# Patient Record
Sex: Female | Born: 1975 | Race: Black or African American | Hispanic: No | Marital: Single | Smoking: Never smoker
Health system: Southern US, Community
[De-identification: ages and names within clinical notes are randomized; demographics above are authoritative.]

## PROBLEM LIST (undated history)

## (undated) DIAGNOSIS — K219 Gastro-esophageal reflux disease without esophagitis: Secondary | ICD-10-CM

## (undated) DIAGNOSIS — F419 Anxiety disorder, unspecified: Secondary | ICD-10-CM

## (undated) DIAGNOSIS — I10 Essential (primary) hypertension: Secondary | ICD-10-CM

## (undated) HISTORY — PX: CHOLECYSTECTOMY: SHX55

---

## 1998-12-18 ENCOUNTER — Emergency Department (HOSPITAL_COMMUNITY): Admission: EM | Admit: 1998-12-18 | Discharge: 1998-12-18 | Payer: Self-pay | Admitting: Emergency Medicine

## 2000-09-13 ENCOUNTER — Inpatient Hospital Stay (HOSPITAL_COMMUNITY): Admission: AD | Admit: 2000-09-13 | Discharge: 2000-09-13 | Payer: Self-pay | Admitting: *Deleted

## 2000-09-13 ENCOUNTER — Inpatient Hospital Stay (HOSPITAL_COMMUNITY): Admission: AD | Admit: 2000-09-13 | Discharge: 2000-09-15 | Payer: Self-pay | Admitting: Obstetrics & Gynecology

## 2000-10-19 ENCOUNTER — Inpatient Hospital Stay (HOSPITAL_COMMUNITY): Admission: AD | Admit: 2000-10-19 | Discharge: 2000-10-19 | Payer: Self-pay | Admitting: Obstetrics and Gynecology

## 2000-10-19 ENCOUNTER — Encounter: Payer: Self-pay | Admitting: Obstetrics and Gynecology

## 2000-11-12 ENCOUNTER — Encounter (HOSPITAL_BASED_OUTPATIENT_CLINIC_OR_DEPARTMENT_OTHER): Payer: Self-pay | Admitting: General Surgery

## 2000-11-13 ENCOUNTER — Emergency Department (HOSPITAL_COMMUNITY): Admission: EM | Admit: 2000-11-13 | Discharge: 2000-11-13 | Payer: Self-pay | Admitting: Emergency Medicine

## 2000-11-13 ENCOUNTER — Encounter: Payer: Self-pay | Admitting: Emergency Medicine

## 2000-11-14 ENCOUNTER — Ambulatory Visit (HOSPITAL_COMMUNITY): Admission: RE | Admit: 2000-11-14 | Discharge: 2000-11-16 | Payer: Self-pay | Admitting: General Surgery

## 2000-11-14 ENCOUNTER — Encounter (INDEPENDENT_AMBULATORY_CARE_PROVIDER_SITE_OTHER): Payer: Self-pay | Admitting: Specialist

## 2000-11-14 ENCOUNTER — Encounter (HOSPITAL_BASED_OUTPATIENT_CLINIC_OR_DEPARTMENT_OTHER): Payer: Self-pay | Admitting: General Surgery

## 2000-11-15 ENCOUNTER — Encounter (HOSPITAL_BASED_OUTPATIENT_CLINIC_OR_DEPARTMENT_OTHER): Payer: Self-pay | Admitting: General Surgery

## 2000-11-20 ENCOUNTER — Encounter: Payer: Self-pay | Admitting: Emergency Medicine

## 2000-11-20 ENCOUNTER — Inpatient Hospital Stay (HOSPITAL_COMMUNITY): Admission: EM | Admit: 2000-11-20 | Discharge: 2000-11-23 | Payer: Self-pay | Admitting: Emergency Medicine

## 2000-11-22 ENCOUNTER — Encounter: Payer: Self-pay | Admitting: Gastroenterology

## 2000-11-23 ENCOUNTER — Encounter (HOSPITAL_BASED_OUTPATIENT_CLINIC_OR_DEPARTMENT_OTHER): Payer: Self-pay | Admitting: General Surgery

## 2000-11-24 ENCOUNTER — Encounter: Payer: Self-pay | Admitting: Gastroenterology

## 2000-11-24 ENCOUNTER — Emergency Department (HOSPITAL_COMMUNITY): Admission: EM | Admit: 2000-11-24 | Discharge: 2000-11-24 | Payer: Self-pay | Admitting: Emergency Medicine

## 2000-12-24 ENCOUNTER — Encounter (HOSPITAL_BASED_OUTPATIENT_CLINIC_OR_DEPARTMENT_OTHER): Payer: Self-pay | Admitting: General Surgery

## 2000-12-24 ENCOUNTER — Emergency Department (HOSPITAL_COMMUNITY): Admission: EM | Admit: 2000-12-24 | Discharge: 2000-12-24 | Payer: Self-pay | Admitting: Emergency Medicine

## 2000-12-27 ENCOUNTER — Inpatient Hospital Stay (HOSPITAL_COMMUNITY): Admission: RE | Admit: 2000-12-27 | Discharge: 2000-12-28 | Payer: Self-pay | Admitting: Gastroenterology

## 2000-12-27 ENCOUNTER — Encounter: Payer: Self-pay | Admitting: Gastroenterology

## 2001-02-04 ENCOUNTER — Emergency Department (HOSPITAL_COMMUNITY): Admission: EM | Admit: 2001-02-04 | Discharge: 2001-02-04 | Payer: Self-pay

## 2001-05-14 ENCOUNTER — Encounter: Payer: Self-pay | Admitting: Emergency Medicine

## 2001-05-14 ENCOUNTER — Emergency Department (HOSPITAL_COMMUNITY): Admission: EM | Admit: 2001-05-14 | Discharge: 2001-05-14 | Payer: Self-pay | Admitting: Emergency Medicine

## 2003-05-12 ENCOUNTER — Encounter: Payer: Self-pay | Admitting: Emergency Medicine

## 2003-05-13 ENCOUNTER — Observation Stay (HOSPITAL_COMMUNITY): Admission: AD | Admit: 2003-05-13 | Discharge: 2003-05-13 | Payer: Self-pay | Admitting: Neurosurgery

## 2003-05-27 ENCOUNTER — Encounter: Admission: RE | Admit: 2003-05-27 | Discharge: 2003-05-27 | Payer: Self-pay | Admitting: Neurosurgery

## 2004-03-25 ENCOUNTER — Emergency Department (HOSPITAL_COMMUNITY): Admission: EM | Admit: 2004-03-25 | Discharge: 2004-03-25 | Payer: Self-pay | Admitting: Emergency Medicine

## 2004-11-02 ENCOUNTER — Emergency Department (HOSPITAL_COMMUNITY): Admission: EM | Admit: 2004-11-02 | Discharge: 2004-11-02 | Payer: Self-pay | Admitting: Emergency Medicine

## 2008-10-04 ENCOUNTER — Emergency Department (HOSPITAL_COMMUNITY): Admission: EM | Admit: 2008-10-04 | Discharge: 2008-10-04 | Payer: Self-pay | Admitting: Emergency Medicine

## 2008-10-25 ENCOUNTER — Emergency Department (HOSPITAL_COMMUNITY): Admission: EM | Admit: 2008-10-25 | Discharge: 2008-10-25 | Payer: Self-pay | Admitting: Family Medicine

## 2008-11-06 ENCOUNTER — Emergency Department (HOSPITAL_COMMUNITY): Admission: EM | Admit: 2008-11-06 | Discharge: 2008-11-06 | Payer: Self-pay | Admitting: Family Medicine

## 2008-12-23 ENCOUNTER — Ambulatory Visit (HOSPITAL_COMMUNITY): Admission: RE | Admit: 2008-12-23 | Discharge: 2008-12-23 | Payer: Self-pay | Admitting: Cardiology

## 2009-06-04 ENCOUNTER — Emergency Department (HOSPITAL_COMMUNITY): Admission: EM | Admit: 2009-06-04 | Discharge: 2009-06-04 | Payer: Self-pay | Admitting: Emergency Medicine

## 2009-10-17 ENCOUNTER — Emergency Department (HOSPITAL_COMMUNITY): Admission: EM | Admit: 2009-10-17 | Discharge: 2009-10-17 | Payer: Self-pay | Admitting: Emergency Medicine

## 2009-10-23 ENCOUNTER — Emergency Department (HOSPITAL_COMMUNITY): Admission: EM | Admit: 2009-10-23 | Discharge: 2009-10-23 | Payer: Self-pay | Admitting: Emergency Medicine

## 2010-08-07 ENCOUNTER — Encounter: Payer: Self-pay | Admitting: Gastroenterology

## 2010-10-25 LAB — POCT CARDIAC MARKERS
CKMB, poc: <1 ng/mL — ABNORMAL LOW (ref 1.0–8.0)
Troponin i, poc: <0.05 ng/mL (ref 0.00–0.09)

## 2010-10-25 LAB — CBC
HCT: 38.5 % (ref 36.0–46.0)
Hemoglobin: 13.4 g/dL (ref 12.0–15.0)
RDW: 12.5 % (ref 11.5–15.5)
WBC: 6 10*3/uL (ref 4.0–10.5)

## 2010-10-25 LAB — DIFFERENTIAL
Eosinophils Relative: 8 % — ABNORMAL HIGH (ref 0–5)
Lymphocytes Relative: 25 % (ref 12–46)
Lymphs Abs: 1.5 10*3/uL (ref 0.7–4.0)
Monocytes Absolute: 0.4 10*3/uL (ref 0.1–1.0)

## 2010-10-25 LAB — BASIC METABOLIC PANEL
GFR calc non Af Amer: >60 mL/min (ref >60)
Glucose, Bld: 94 mg/dL (ref 70–99)
Potassium: 2.9 mEq/L — ABNORMAL LOW (ref 3.5–5.1)
Sodium: 138 mEq/L (ref 135–145)

## 2010-12-01 NOTE — Consult Note (Signed)
[facility]  Patient:    Christie Buckley, Christie Buckley                  MRN: [id number] Proc. Date: 11/24/00 Adm. Date:  [id number] Disc. Date: [id number] Attending:  Sandi Raveling CC:         Verlin Grills, M.D.  [name] [name], M.D.   Consultation Report  REASON FOR CONSULTATION:  Abdominal pain after ERCP.  HISTORY OF ILLNESS:  Patient is a 35 year old white female who underwent laparoscopic cholecystectomy on Nov 13, 2000, with postoperative ERCP which was apparently unrevealing on May 2nd.  On May 8th, she developed increased abdominal pain and elevated liver function tests and on the 10th, underwent ERCP to rule out retained common bile duct stones.  This revealed no stones but revealed an apparent common hepatic duct stricture possibly related to clips from the previous surgery.  She underwent repeat ERCP on May 11th with placement of a stent.  Approximately 4 oclock, approximately one hour after leaving the hospital, she began developing abdominal pain which persisted through the night, mainly on the right upper quadrant and the right flank, worsened by meals, and this morning, she vomited after taking Vicodin.  She was also discharged on an antibiotic which I believe was Cipro.  She called me and I had her come to the emergency room.  She has had a bowel movement since the ERCP yesterday and has not had any abdominal distention.  She had stable vital signs in the emergency room and her pain spontaneously decreased, although she initially rated it as a 10 on a scale of 1 to 10.  PAST MEDICAL HISTORY:  Essentially unremarkable except for the above.  The patient delivered a child three months ago.  PHYSICAL EXAMINATION:  GENERAL:  Well-developed, well-nourished black female currently in no acute distress.  VITAL SIGNS:  Blood pressure 157/94, pulse 85, respirations 24, temperature 98.6.  HEENT:  Unremarkable.  No scleral  icterus.  LUNGS:  Clear.  HEART:  Regular rate and rhythm without murmur.  ABDOMEN:  Soft, nondistended, with normoactive bowel sounds.  No hepatosplenomegaly or masses.  Some tenderness in the right upper quadrant and epigastric area with no rebound.  LABORATORY DATA:  Amylase 95, lipase 61, SGOT 33, SGPT 271, alkaline phosphatase 257, total bilirubin 0.8.  On Nov 21, 2000, bilirubin was 2.9, alkaline phosphatase 351, SGOT 565, SGPT 775, amylase 85, lipase 52.  Flat and upright abdominal films show no free air, obstruction or ileus and the biliary stent appears to be in good position.  IMPRESSION:  Abdominal pain after endoscopic retrograde cholangiopancreatogram and laparoscopic cholecystectomy, with minimally elevated lipase but normal amylase and improving liver function tests after stent placement.  I do not see any evidence of overt pancreatitis and no signs of clinical instability by labs, vital signs and abdominal exam, no evidence of perforation or stent malfunction at this time.  PLAN:  I am comfortable managing as an outpatient with clear liquid diet today, continuation of p.r.n. Vicodin and her antibiotics as prescribed.  She will make an appointments with Dr. Luisa Hart L. Ballen and Dr. Charolett Bumpers III and will call at any point if her pain worsens or if she has recurrent vomiting, fever or jaundice. DD:  11/24/00 TD:  11/25/00 Job: [id number]

## 2010-12-01 NOTE — Procedures (Signed)
[place]  Patient:    Christie Buckley, Christie Buckley                    MRN: [id number] Proc. Date: 11/23/00 Attending:  Verlin Grills, M.D. CC:         [name]. [name], M.D.   Procedure Report  PROCEDURE:  Endoscopic retrograde cholangiography with a 7 French, 12 cm length biliary stent placement.  PROCEDURE INDICATION:  Christie Buckley (date of birth 1976/01/17) is a 35 year old female with a common hepatic duct stricture.  She is seen today to place a biliary stent.  Approximately one week ago, I performed an endoscopic retrograde cholangiogram with endoscopic sphincterotomy to remove distal common bile duct stones.  She tolerated the procedure last week well.  Christie Buckley was readmitted to the hospital Nov 20, 2000, with recurrent abdominal pain and intrahepatic biliary ductal dilation by CT scan of the abdomen.  Her bilirubin and liver enzymes were elevated.  Last night I performed an endoscopic retrograde cholangiogram, which revealed a common hepatic duct stricture.  I was unable to place the biliary stent last night, and I am seeing her this morning to place the biliary stent.  ENDOSCOPIST:  Verlin Grills, M.D.  PREMEDICATION:  Fentanyl 250 mcg, Versed 22.5 mg.  ENDOSCOPE:  Olympus therapeutic duodenoscope.  A 7 French, 12 cm length Cotton plastic biliary stent.  DESCRIPTION OF PROCEDURE:  After obtaining informed consent, Ms. Carrier was placed in the prone position on the fluoroscopy table.  I administered intravenous fentanyl and intravenous Versed to achieve conscious sedation for the procedure.  The patients blood pressure, oxygen saturation, and cardiac rhythm were monitored throughout the procedure and documented in the medical record.  The Olympus therapeutic duodenoscope was passed through the posterior hypopharynx and down the esophagus without difficulty.  I did not examine the esophagus with  the side-viewing duodenoscope.  The stomach was entered.  A normal-appearing pylorus was intubated and the endoscope advanced to the second portion of the duodenum.  The sphincterotomy of the major papilla was widely patent.  The common bile duct was selectively cannulated with the sphincterotome.  A cholangiogram was performed, revealing the common hepatic duct stricture.  A guidewire was placed into the intrahepatic ductal system.  The 11-19-08 [place] biliary dilator was passed.  I attempted to place a 10 [place] biliary stent but was unable to push the stent across the common hepatic duct stricture.  The 10 [place] biliary stent was removed.  Over the guidewire, the 7 [place], 10 cm Cotton plastic biliary stent was easily placed.  The patient tolerated the procedure well.  ASSESSMENT:  Common hepatic duct stricture, stented with the 7 French, 12 cm length biliary stent.  RECOMMENDATIONS:  I will discuss future management of this patient with Drs. Leonie Man, Dr. Caralyn Guile at [facility], and possibly seek advice from the [facility] in [place].  Options include surgery (hepatojejunostomy) versus endoscopic stenting over a nine to 12 month period of time. DD:  11/23/00 TD:  11/25/00 Job: [id number]

## 2010-12-01 NOTE — Discharge Summary (Signed)
[place]  Patient:    Christie Buckley, Christie Buckley                  MRN: [id number] Adm. Date:  [id number] Disc. Date: [id number] Attending:  Sandi Raveling CC:         [name]. [name], M.D. x 2  Charolett Bumpers III, M.D.   Discharge Summary  ADMITTING DIAGNOSIS:  Biliary obstruction.  DISCHARGE DIAGNOSIS:  Hepatic duct stricture causing biliary obstruction.  PROCEDURES:  Endoscopic retrograde cholangiopancreatography with stent placement.  COMPLICATIONS:  None.  CONDITION ON DISCHARGE:  Improved.  HISTORY OF PRESENT ILLNESS:  Christie Buckley is a 35 year old woman who was recently postpartum some time ago with severe biliary symptoms.  She was admitted to the hospital and underwent laparoscopic cholecystectomy with intraoperative cholangiogram on Nov 13, 2000.  During her cholangiogram, it showed indications of obstruction and she was then treated in consultation by Dr. Charolett Bumpers III with an ERCP and an endoscopic retrograde sphincterotomy with clearing of her common bile duct. She was discharged and was doing well at home but returned on Nov 20, 2000 to the emergency room complaining of severe abdominal pain.  On evaluation, it was noted that she had elevated bilirubin of 2.8, SGOT of 496, SGPT of 8.9, and alkaline phosphatase is elevated to 367.  She was admitted to the hospital.  She was afebrile.  She was seen again in consultation by Dr. Charolett Bumpers III and repeat ERCP done on Nov 22, 2000 showed evidence of a common hepatic duct stricture.  Close to this area, a stricture is a cystic duct clip.  The stricture starts somewhat below the level of the clip in the proximal common bile duct and extends through the region of the common hepatic duct proximally.  She was then returned to the endoscopy suite on Nov 23, 2000 where she had a size 7 [place] stent placed across the stricture.  She is being discharged to follow up  in the office in approximately 10 days.  DISCHARGE MEDICATIONS: 1. Bactrim DS 1 p.o. b.i.d. 2. Vicodin 1 q.4h. p.r.n. pain.  ACTIVITY:  Unrestricted.  DIET:  Unrestricted.  DISCHARGE INSTRUCTIONS:  The patient is to call for any recurrent pain, fever, nausea, or vomiting. DD:  11/23/00 TD:  11/26/00 Job: [id number]

## 2010-12-01 NOTE — Consult Note (Signed)
NAME:  Christie Buckley, Christie Buckley NO.:  [id number]   MEDICAL RECORD NO.:  [id number]                   PATIENT TYPE:  EMS   LOCATION:  ED                                   FACILITY:  [facility]   PHYSICIAN:  Carren Rang, M.D.                 DATE OF BIRTH:  1976-01-02   DATE OF CONSULTATION:  05/06/2003  DATE OF DISCHARGE:                                   CONSULTATION   ADMITTING DIAGNOSIS:  C1-2 early subluxation.   HISTORY OF PRESENT ILLNESS:  The patient is a very pleasant 35 year old  female who was at work earlier today and reached up and was stretching her  neck and her shoulders, and felt a pop in her neck, and experienced  immediate pain with left-ward head deviation and severe muscle spasm on the  side of her neck.  The patient also experienced at that time some transient  numbness on the right side of her face, right arm, and both legs.  The  patient subsequently was taken to the emergency room was evaluated, and neck  CT revealed possible early subluxation of C1-2, and we are consulted.  Currently, the patient said her pain is significantly improved.  She was  given 5 mg of p.o. Valium in the ER.  She had complete resolution of the  sensation of her face, arms, and legs.  She has no numbness or tingling in  her face, her arms, her legs.  No weakness that she has noted.  No blurred  vision.  No other bulbar signs.   PAST HISTORY:  Negative.   SURGICAL HISTORY:  Negative.   MEDICATION ALLERGIES:  None.   MEDICATIONS:  She is on no medications currently.   PHYSICAL EXAMINATION:  GENERAL:  She is an awake and alert 34 year old  female in no apparent distress.  HEENT:  Within normal limits.  Extraocular movements are intact.  NECK:  Appears to be in mid-position.  NEUROLOGIC:  Cranial nerves are intact.  Pupils are equal, round and  reactive to light.  Her strength is 5/5 in her sternocleidomastoid strap  muscles, deltoid, biceps, triceps, wrist  flexion and extension, hand  intrinsics, as well as psoas.  She has reflexes that are 3+, but appear to  be nonpathologic.  She has 1-2 beats of ankle clonus, but she has no  positive Babinski.  Her toes are downgoing bilaterally.  She has negative  Hoffman's sign.  She is in a cervical collar.  On palpation, she does have  some midline tenderness around the C1-2 joint, but her head is in good  position.  She seems to be moving it without difficulty, and she is  describing her pain as now a 4/10.   Her MRI scan subsequent to the CT scan did not show any evidence of a rotary  subluxation.  It did not show, at least preliminarily, any evidence of  transverse disruption or  ligamentous injury; however, this is not officially  read out yet.    PLAN:  My plan is to transfer her from [facility] to [facility], admit for  23 hour observation, leave her in a cervical collar, repeat a CT scan to  confirm that her alignment is normal.  Will await the final read of her MRI  scan to rule out any laxity of the transverse ligament or subsequent  _________ ligament, and pending the results of that make a disposition  regarding any intervention.  Will place her on Valium, as well as Lortab,  and activity ad lib.     Donalee Citrin, M.D.                           Carren Rang, M.D.    GC/MEDQ  D:  05/12/2003  T:  05/13/2003  Job:  [id number]

## 2010-12-01 NOTE — Op Note (Signed)
. [facility]  Patient:    Christie Buckley, Christie Buckley                  MRN: [id number] Proc. Date: 11/14/00 Adm. Date:  [id number] Disc. Date: [id number] Attending:  Devoria Albe CC:         Miguel Aschoff, M.D.   Operative Report  PREOPERATIVE DIAGNOSIS:  Chronic calculous cholecystitis.  POSTOPERATIVE DIAGNOSES:  Chronic calculous cholecystitis and choledocholithiasis.  PROCEDURE:  Laparoscopic cholecystectomy, intraoperative cholangiogram.  SURGEON:  Luisa Hart L. [name], M.D.  ASSISTANT:  Marnee Spring. Wiliam Ke, M.D.  ANESTHESIA:  General.  CLINICAL NOTE:  Christie Buckley is a 35 year old recently postpartum female who presents with right upper quadrant abdominal pain, which on evaluation shows that she has cholelithiasis.  Liver function studies have been within normal limits, no evidence of hyperamylasemia or elevations of her lipase. She comes to the operating room now for laparoscopic cholecystectomy.  DESCRIPTION OF PROCEDURE:  Following the induction of satisfactory anesthesia, with the patient positioned supinely, the abdomen was prepped and draped routinely.  Open laparoscopy is created at her umbilicus with insufflation of the peritoneal cavity to 14 mmHg pressure following the insertion of a Hasson cannula.  The camera is inserted, the abdomen explored.  The liver edges are sharp, liver surfaces smooth.  The gallbladder had some mild amount of scarring but was otherwise filled with multiple stones.  Duodenal sweep appeared to be normal.  Anterior gastric wall, small and large intestines were viewed, and they appeared normal.  The adnexal structures and pelvic organs were not visualized.  Under direct vision, epigastric and lateral ports were placed and the gallbladder was grasped and retracted cephalad.  Dissection carried down in the region of the ampulla with isolation of the cystic artery and cystic duct.  Cystic artery was doubly clipped and  transected.  Cystic duct is clipped proximally and opened.  Upon opening the cystic duct, multiple stones could be milked from the cystic duct and from the common duct.  I inserted a Reddick catheter into the cystic duct and injected one-half strength Hypaque dye into the biliary system.  It showed contrast entering up to the upper radicles and then some delayed filling of the common bile duct, showing multiple filling defects within the common bile duct, and there was no visualization of the duodenum.  The cholangiocatheter was then removed, and the cystic duct was doubly clipped and transected.  The gallbladder was dissected free from the liver bed and removed and forwarded for pathologic evaluation.  The abdomen was thoroughly irrigated with normal saline and sucked dry.  All areas of dissection were checked for hemostasis and noticed to be dry.  The pneumoperitoneum was allowed to deflate and the trocars removed under direct vision.  Wounds closed in layers as follows:  Umbilical wound in two layers with 0 Dexon and 4-0 Dexon, epigastric and lateral flank wounds with 4-0 Dexon sutures.  All wounds reinforced with Steri-Strips and sterile dressings applied.  The patient then removed from the operating room to the recovery room in stable condition.  She tolerated the procedure well. DD:  11/14/00 TD:  11/15/00 Job: [id number]

## 2010-12-01 NOTE — H&P (Signed)
[facility]  Patient:    Christie Buckley, Christie Buckley                    MRN: [id number] Adm. Date:  12/27/00 Attending:  Verlin Grills, M.D. CC:         [name]. [name], M.D.  [name], M.D., Division of Digestive Diseases,  Dept. of Internal Medicine, [facility]   History and Physical  PROBLEMS: 1. Common hepatic duct stricture. 2. Nov 13, 2000, laparoscopic cholecystectomy. 3. Nov 15, 2000, endoscopic retrograde cholangiography with endoscopic    sphincterotomy and common bile duct stone fragment removal. 4. Nov 23, 2000, placement of a 7-French 12-cm length biliary stent.  HISTORY:  Christie Buckley is a 35 year old female.  On Nov 13, 2000, she had a laparoscopic cholecystectomy performed by Dr. Luisa Hart L. Ballen.  Her operative cholangiogram showed possible common bile duct obstruction.  On Nov 15, 2000, she underwent an endoscopic retrograde cholangiogram with endoscopic sphincterotomy to remove small common bile duct stone fragments performed by me.  On Nov 23, 2000, Christie Buckley was rehospitalized with abdominal pain, elevated liver enzymes and a cholangitis picture.  Repeat endoscopic retrograde cholangiogram revealed a proximal common hepatic duct stricture; a 7-French 12-cm length plastic biliary stent was placed.  On December 24, 2000, her hepatic enzymes were completely normal.  Christie Buckley is seen today to remove the 7-French 12-cm length plastic biliary stent and reexamine her common bile duct, common hepatic duct and intrahepatic ducts.  MEDICATION ALLERGIES:  None.  CURRENT MEDICATIONS: 1. Cipro 500 mg b.i.d. 2. Oxycodone/acetaminophen.  PAST MEDICAL HISTORY:  Nov 13, 2000, laparoscopic cholecystectomy; Nov 15, 2000, endoscopic retrograde cholangiogram with endoscopic sphincterotomy; Nov 23, 2000, 7-French 12-cm plastic biliary stent placed to stent a proximal common hepatic duct stricture.  PHYSICAL  EXAMINATION:  GENERAL APPEARANCE:  Christie Buckley appears quite healthy.  HEENT:  Sclerae nonicteric.  LUNGS:  Clear to auscultation.  CARDIAC:  Regular rhythm without murmurs.  ABDOMEN:  Soft, flat and nontender. DD:  12/27/00 TD:  12/27/00 Job: [id number]

## 2010-12-01 NOTE — Consult Note (Signed)
[facility]  Patient:    Christie Buckley, Christie Buckley                    MRN: [id number] Proc. Date: 12/24/00 Attending:  Verlin Grills, M.D. CC:         [name]. [name], M.D.   Consultation Report  PROBLEM:  Unexplained chest and abdominal pain.  HISTORY OF PRESENT ILLNESS:  [name] (date of birth:  09-10-1975) is a 35 year old female. Approximately four months ago, she had an uncomplicated childbirth.  Nov 13, 2000, she underwent a laparoscopic cholecystectomy with intraoperative cholangiogram revealing possible biliary obstruction. On Nov 15, 2000, she underwent an endoscopic retrograde cholangiogram with endoscopic sphincterotomy to remove distal common bile duct stones. On Nov 23, 2000, Christie Buckley was hospitalized with abdominal pain and elevated liver enzymes. Repeat endoscopic retrograde cholangiogram revealed a patent endoscopic sphincterotomy and probable ischemic proximal common hepatic duct stricture. A 7-French, 12 cm length plastic biliary stent was placed.  Christie Buckley presents to the [facility] Emergency Room with a two-week history of intermittent anterior and lower abdominal discomfort without vomiting or chills. She has been somewhat constipated. She reports a normal appetite. She denies breathing difficulty. She has been taking Darvocet for pain as an outpatient.  CURRENT MEDICATIONS:  Darvocet.  PAST MEDICAL HISTORY: 1. Childbirth four months ago. 2. Laparoscopic cholecystectomy, Nov 13, 2000. 3. Endoscopic retrograde cholangiogram with endoscopic sphincterotomy, Nov 15, 2000. 4. Endoscopic placement of a 7-French, 12 cm length plastic biliary stent, Nov 23, 2000, to manage a common hepatic duct stricture.  PHYSICAL EXAMINATION:  VITAL SIGNS:  Blood pressure 147/95, temperature 99.4 degrees.  GENERAL:  Christie Buckley is lying comfortably on the emergency room stretcher.  HEENT:  Sclerae  nonicteric.  CARDIAC:  Regular rhythm without murmurs.  ABDOMEN:  Soft and flat. No hepatosplenomegaly or palpable masses. The patient has a discomfort across her lower abdomen to deep palpation, but there are no signs of peritonitis. She reports no dysuria.  LUNGS:  Clear.  LABORATORY DATA:  White blood cell count 4900, hemoglobin 13.6 g. Complete metabolic profile was normal, including normal liver enzymes and bilirubin. Serum amylase and lipase normal. Urinalysis pending.  Acute abdominal x-ray series reveal several dilated small bowel loops without radiographic evidence of obstruction or free air.  PLAN:  I am starting Cipro 500 mg b.i.d. and scheduling Christie Buckley for an endoscopic retrograde cholangiogram to remove her 7-French, 12 cm biliary stent. DD:  12/24/00 TD:  12/24/00 Job: [id number]

## 2010-12-01 NOTE — Procedures (Signed)
[facility]  Patient:    Christie Buckley, Christie Buckley                    MRN: [id number] Proc. Date: 12/27/00 Adm. Date:  12/27/00 Attending:  Verlin Grills, M.D. CC:         [name]. [name], M.D.  [name], M.D., Division of Digestive Diseases,  Dept. of Internal Medicine, [facility]   Procedure Report  PROCEDURE:  Endoscopic retrograde cholangiography with biliary stent exchange.  ENDOSCOPIST:  Verlin Grills, M.D.  DESCRIPTION OF PROCEDURE:  After obtaining informed consent, [name] was placed in the prone position on the fluoroscopy table.  I administered intravenous Versed (15 mg) and intravenous Demerol (140 mg) to achieve conscious sedation throughout the procedure; she also received glucagon to control duodenal peristalsis.  The Olympus therapeutic duodenoscope was passed through the posterior hypopharynx, down the esophagus and into the proximal stomach without difficulty.  The esophagus was not examined.  The distal gastric antrum and pylorus appeared normal.  The pylorus was easily intubated and the endoscope advanced to the second portion of the duodenum without examining the duodenal bulb.  Major papilla:  The major papilla was easily identified with the 7-French 12-cm biliary stent extruding through the patent endoscopic sphincterotomy.  The biliary stent was easily removed.  The 15-mm balloon occlusion catheter was placed into the proximal common hepatic duct.  The entire biliary tree was x-rayed using contrast through the occlusion balloon catheter revealing a slightly narrowed proximal common hepatic duct.  There were no tight strictures or filling defects noted.  The 11-19-08 graduated dilating catheter was easily placed across the common hepatic duct stricture with a guidewire in place.  The 10-French 12-cm length plastic biliary stent was easily placed to stent the common hepatic duct stricture.  The  pancreatic duct was never cannulated and contrast was never injected into the pancreatic duct.  ASSESSMENT:  Mild common hepatic duct stricture, post laparoscopic cholecystectomy, stented with the 10-French 12-cm plastic biliary stent. DD:  12/27/00 TD:  12/27/00 Job: [id number]

## 2010-12-01 NOTE — Procedures (Signed)
[place]. [facility]  Patient:    Christie Buckley, Christie Buckley                  MRN: [id number] Proc. Date: 11/15/00 Adm. Date:  [id number] Disc. Date: [id number] Attending:  Sonda Primes CC:         [name]. [name], M.D.   Procedure Report  PROCEDURE:  Endoscopic retrograde cholangiogram with endoscopic sphincterotomy and biliary stone removal.  REFERRING PHYSICIAN:  Luisa Hart L. [name], M.D.  PROCEDURE INDICATION:  [name] (date of birth 1976-01-16) is a 35 year old female admitted to the hospital with acute pancreatitis secondary to gallstones.  She underwent a laparoscopic cholecystectomy Nov 14, 2000.  Her operative cholangiogram revealed multiple small common bile duct stones.  I discussed with Ms. Vogler the complications associated with ERCP with sphincterotomy to remove common bile duct stones, including pancreatitis, bleeding, infection, and perforation.  Ms. Rayle has signed the operative permit.  ENDOSCOPIST:  Verlin Grills, M.D.  PREMEDICATION:  Versed 16 mg, fentanyl 100 mcg.  ENDOSCOPE:  Olympus diagnostic duodenoscope.  DESCRIPTION OF PROCEDURE:  After obtaining informed consent, the patient was placed in the left lateral decubitus position.  I administered intravenous Versed and intravenous fentanyl to achieve conscious sedation for the procedure.  The patients blood pressure, oxygen saturation, and cardiac rhythm were monitored throughout the procedure and documented in the medical record.  The Olympus diagnostic duodenoscope was passed through the posterior hypopharynx, down the esophagus, into the proximal stomach without difficulty. The antrum and pylorus appeared normal.  The pylorus was easily intubated and the endoscope advanced to the second portion of the duodenum.  Major papilla:  Endoscopically, the major papilla appeared normal.  Pancreatogram:  Pancreatic duct was not cannulated,  and a pancreatogram was not performed.  Cholangiogram:  Using the triple-lumen sphincterotome, the common bile duct was selectively cannulated.  The biliary system was filled with contrast, revealing small filling defects in the distal common bile duct.  A small endoscopic sphincterotomy was performed without apparent complications.  The 8 mm balloon catheter was used to clear the common bile duct of small, yellowish-appearing stones.  The patient tolerated the procedure satisfactorily. DD:  11/15/00 TD:  11/18/00 Job: [id number]

## 2010-12-01 NOTE — Discharge Summary (Signed)
[facility]  Patient:    Christie Buckley, Christie Buckley                  MRN: [id number] Adm. Date:  [id number] Disc. Date: [id number] Attending:  Mickle Mallory                           Discharge Summary  DISCHARGE DIAGNOSES:          1. Term pregnancy delivered viable female infant                                  Apgars 8 and 9, weight 7 pounds 3 ounces.                               2. Blood type Rh negative.  PROCEDURE:                    Normal spontaneous delivery.  SUMMARY:                      This gravida 2, para 0 at term was admitted in early labor.  She transferred to our practice in the third trimester of her pregnancy from a practice in [place].  She is a Curator and refuses all blood products.                                At the time of admission her cervix was approximately 2 cm dilated, vertex, and -2 station.  Estimated fetal weight was 7.5-8 pounds.  Fetal heart was reactive.  Patient was positive for group B strep and she was begun on penicillin protocol and underwent amniotomy and Pitocin augmentation of her labor.                                She subsequently had a normal spontaneous delivery of a viable female infant weighing 7 pounds 3 ounces with Apgars of 8 and 9 over an intact perineum.  There were no tears.  The patient was bottle feeding.  On the morning of March 2 her hemoglobin was 11.1 with a white count of 17,500 and a platelet count of 256,000.  On the morning of March 3 she was ambulating well, tolerating a regular diet well, having normal bowel and bladder function, was afebrile, and was deemed ready for discharge. Accordingly, she was given all discharge prescriptions which included Tylox one to two p.o. q.4-6h., Motrin 600 mg q.6h. for less pain, vitamins one q.d., ferrous sulfate 300 mg q.d.  She will return to the office in four weeks time and was given all careful instructions at the time of  discharge per instruction brochure and understood all instructions well.  CONDITION ON DISCHARGE:       Improved.  ADDENDUM:                     The patient did receive RhoGAM prior to discharge. DD:  10/09/00 TD:  10/09/00 Job: [id number]

## 2010-12-01 NOTE — Procedures (Signed)
[place]. [facility]  Patient:    Christie Buckley, Christie Buckley                    MRN: [id number] Proc. Date: 11/22/00 Attending:  Verlin Grills, M.D. CC:         [name]. [name], M.D.   Procedure Report  PROCEDURE:  Endoscopic retrograde cholangiogram.  REFERRING PHYSICIAN:  Luisa Hart L. [name], M.D.  INDICATIONS FOR PROCEDURE:  The patient is a 35 year old female.  On Nov 13, 2000, she underwent a laparoscopic cholecystectomy.  Her intraoperative cholangiogram revealed filling of the intrahepatic bile ducts, but no filling of the extrahepatic bile ducts.  There appeared to be obstruction by operative cholangiogram.  On Nov 15, 2000, she underwent an endoscopic retrograde cholangiogram with endoscopic sphincterotomy.  Her major papilla appeared normal; her pancreatic duct was not cannulated; her cholangiogram revealed small filling defects in the distal common bile duct.  An endoscopic sphincterotomy was performed without complications.  The 8 mm balloon catheter was used to sweep small yellowish-appearing stone fragments through the endoscopic sphincterotomy from the common bile duct.  The patient was readmitted to [facility] through the emergency room Nov 20, 2000, with an elevated bilirubin, alkaline phosphatase, and liver transaminases.  Her abdominal CT scan revealed dilation of the intrahepatic bile ducts without dilation of the extrahepatic bile ducts.  The patient is now scheduled for repeat ERC to determine the etiology of biliary obstruction.  ENDOSCOPIST:  Verlin Grills, M.D.  PREMEDICATION:  Fentanyl 150 mcg and Versed 20 mg.  ENDOSCOPE:  Olympus therapeutic duodenoscope.  DESCRIPTION OF PROCEDURE:  After obtaining informed consent, the patient was placed in the prone position on the fluoroscopy table.  I administered intravenous fentanyl and intravenous Versed to achieve conscious sedation for the procedure.  The patients  blood pressure, oxygen saturation, and cardiac rhythm were monitored throughout the procedure, and documented in the medical record.  The Olympus therapeutic duodenoscope was passed through the posterior hypopharynx, down the esophagus, and the proximal stomach without difficulty. A normal-appearing pylorus was intubated without examination of the esophagus or stomach.  The endoscope was advanced to the second portion of the duodenum.  Major papilla - the major papilla was easily identified.  The endoscopic sphincterotomy appeared patent.  The 8 mm balloon catheter was used to cannulate the common bile duct.  There was difficulty in advancing a guidewire into the intrahepatic ductal system. A cholangiogram was performed.  There is narrowing of the common hepatic duct secondary to a surgical clip which appears to have been placed partially across the common hepatic duct.  I was able to sweep the extrahepatic biliary system with the 8 mm balloon catheter with some resistance met at the level of the common hepatic duct clip.  I was unable to sweep pass the common hepatic duct clip using the inflated 12 mm balloon catheter.  Despite multiple sweeps, no stone fragments were returned through the patent sphincterotomy.  The pancreatic duct was not cannulated and a pancreatogram was not performed.  ASSESSMENT:  Partial common hepatic duct obstruction secondary to a surgical clip.  PLAN:  The patient will undergo repeat endoscopic retrograde cholangiography to place a stent to maintain patency of her extrahepatic bile duct system. The pros and cons of surgery versus endoscopic therapy to manage this laparoscopic cholecystectomy complication will be discussed. DD:  11/22/00 TD:  11/24/00 Job: [id number]

## 2011-05-18 ENCOUNTER — Emergency Department (HOSPITAL_COMMUNITY): Payer: Self-pay

## 2011-05-18 ENCOUNTER — Observation Stay (HOSPITAL_COMMUNITY)
Admission: EM | Admit: 2011-05-18 | Discharge: 2011-05-20 | Disposition: A | Discharge status: Home / Self Care | Payer: Self-pay | Attending: Internal Medicine | Admitting: Internal Medicine

## 2011-05-18 DIAGNOSIS — I10 Essential (primary) hypertension: Secondary | ICD-10-CM | POA: Insufficient documentation

## 2011-05-18 DIAGNOSIS — Z9089 Acquired absence of other organs: Secondary | ICD-10-CM | POA: Insufficient documentation

## 2011-05-18 DIAGNOSIS — R000 Tachycardia, unspecified: Secondary | ICD-10-CM | POA: Diagnosis present

## 2011-05-18 DIAGNOSIS — D509 Iron deficiency anemia, unspecified: Secondary | ICD-10-CM

## 2011-05-18 DIAGNOSIS — Z8249 Family history of ischemic heart disease and other diseases of the circulatory system: Secondary | ICD-10-CM | POA: Insufficient documentation

## 2011-05-18 DIAGNOSIS — F419 Anxiety disorder, unspecified: Secondary | ICD-10-CM | POA: Diagnosis present

## 2011-05-18 DIAGNOSIS — D649 Anemia, unspecified: Secondary | ICD-10-CM | POA: Insufficient documentation

## 2011-05-18 DIAGNOSIS — R002 Palpitations: Principal | ICD-10-CM | POA: Insufficient documentation

## 2011-05-18 DIAGNOSIS — R0789 Other chest pain: Secondary | ICD-10-CM | POA: Insufficient documentation

## 2011-05-18 DIAGNOSIS — Z79899 Other long term (current) drug therapy: Secondary | ICD-10-CM | POA: Insufficient documentation

## 2011-05-18 DIAGNOSIS — F411 Generalized anxiety disorder: Secondary | ICD-10-CM | POA: Insufficient documentation

## 2011-05-18 DIAGNOSIS — K219 Gastro-esophageal reflux disease without esophagitis: Secondary | ICD-10-CM | POA: Insufficient documentation

## 2011-05-18 DIAGNOSIS — R9431 Abnormal electrocardiogram [ECG] [EKG]: Secondary | ICD-10-CM | POA: Insufficient documentation

## 2011-05-18 LAB — URINE MICROSCOPIC-ADD ON

## 2011-05-18 LAB — POCT I-STAT, CHEM 8
BUN: 7 mg/dL (ref 6–23)
Calcium, Ion: 1.24 mmol/L (ref 1.12–1.32)
Creatinine, Ser: 0.8 mg/dL (ref 0.50–1.10)
Creatinine, Ser: 0.8 mg/dL (ref 0.50–1.10)
Glucose, Bld: 86 mg/dL (ref 70–99)
HCT: 41 % (ref 36.0–46.0)
Hemoglobin: 13.9 g/dL (ref 12.0–15.0)
Hemoglobin: 13.9 g/dL (ref 12.0–15.0)
Potassium: 3.9 mEq/L (ref 3.5–5.1)
Sodium: 137 mEq/L (ref 135–145)
Sodium: 138 mEq/L (ref 135–145)
TCO2: 23 mmol/L (ref 0–100)
TCO2: 24 mmol/L (ref 0–100)

## 2011-05-18 LAB — URINALYSIS, ROUTINE W REFLEX MICROSCOPIC
Ketones, ur: NEGATIVE mg/dL
Leukocytes, UA: NEGATIVE
Protein, ur: NEGATIVE mg/dL
Urobilinogen, UA: 0.2 mg/dL (ref 0.0–1.0)

## 2011-05-18 LAB — DIFFERENTIAL
Basophils Absolute: 0.1 10*3/uL (ref 0.0–0.1)
Eosinophils Relative: 8 % — ABNORMAL HIGH (ref 0–5)
Lymphocytes Relative: 34 % (ref 12–46)
Lymphs Abs: 2.3 10*3/uL (ref 0.7–4.0)
Neutro Abs: 3.4 10*3/uL (ref 1.7–7.7)
Neutrophils Relative %: 51 % (ref 43–77)

## 2011-05-18 LAB — POCT PREGNANCY, URINE: Preg Test, Ur: NEGATIVE

## 2011-05-18 LAB — CBC
HCT: 39.4 % (ref 36.0–46.0)
Hemoglobin: 12.5 g/dL (ref 12.0–15.0)
MCV: 82.8 fL (ref 78.0–100.0)
RBC: 4.76 MIL/uL (ref 3.87–5.11)
WBC: 6.6 10*3/uL (ref 4.0–10.5)

## 2011-05-18 LAB — CARDIAC PANEL(CRET KIN+CKTOT+MB+TROPI)
CK, MB: 1.8 ng/mL (ref 0.3–4.0)
Total CK: 116 U/L (ref 7–177)
Troponin I: <0.3 ng/mL (ref <0.30)

## 2011-05-18 LAB — RAPID URINE DRUG SCREEN, HOSP PERFORMED
Amphetamines: NOT DETECTED
Opiates: NOT DETECTED
Tetrahydrocannabinol: NOT DETECTED

## 2011-05-19 DIAGNOSIS — R9431 Abnormal electrocardiogram [ECG] [EKG]: Secondary | ICD-10-CM

## 2011-05-19 LAB — COMPREHENSIVE METABOLIC PANEL
ALT: 16 U/L (ref 0–35)
AST: 13 U/L (ref 0–37)
Alkaline Phosphatase: 68 U/L (ref 39–117)
CO2: 24 mEq/L (ref 19–32)
Chloride: 101 mEq/L (ref 96–112)
GFR calc Af Amer: >90 mL/min (ref >90)
GFR calc non Af Amer: >90 mL/min (ref >90)
Glucose, Bld: 106 mg/dL — ABNORMAL HIGH (ref 70–99)
Potassium: 3.6 mEq/L (ref 3.5–5.1)
Sodium: 135 mEq/L (ref 135–145)
Total Bilirubin: 0.1 mg/dL — ABNORMAL LOW (ref 0.3–1.2)

## 2011-05-19 LAB — DIFFERENTIAL
Eosinophils Absolute: 0.4 10*3/uL (ref 0.0–0.7)
Eosinophils Relative: 7 % — ABNORMAL HIGH (ref 0–5)
Lymphocytes Relative: 38 % (ref 12–46)
Lymphs Abs: 2.5 10*3/uL (ref 0.7–4.0)
Monocytes Absolute: 0.3 10*3/uL (ref 0.1–1.0)

## 2011-05-19 LAB — CBC
HCT: 35.3 % — ABNORMAL LOW (ref 36.0–46.0)
MCH: 26.6 pg (ref 26.0–34.0)
MCHC: 32.3 g/dL (ref 30.0–36.0)
MCV: 82.3 fL (ref 78.0–100.0)
Platelets: 368 10*3/uL (ref 150–400)
RDW: 13.2 % (ref 11.5–15.5)
WBC: 6.7 10*3/uL (ref 4.0–10.5)

## 2011-05-19 LAB — CARDIAC PANEL(CRET KIN+CKTOT+MB+TROPI): Relative Index: 1.5 (ref 0.0–2.5)

## 2011-05-19 LAB — LIPID PANEL
HDL: 43 mg/dL (ref >39)
LDL Cholesterol: 102 mg/dL — ABNORMAL HIGH (ref 0–99)
Total CHOL/HDL Ratio: 3.7 RATIO
Triglycerides: 67 mg/dL (ref <150)
VLDL: 13 mg/dL (ref 0–40)

## 2011-05-19 LAB — APTT: aPTT: 34 seconds (ref 24–37)

## 2011-05-19 LAB — IRON AND TIBC
Iron: 54 ug/dL (ref 42–135)
UIBC: 276 ug/dL (ref 125–400)

## 2011-05-19 LAB — PRO B NATRIURETIC PEPTIDE: Pro B Natriuretic peptide (BNP): 5 pg/mL (ref 0–125)

## 2011-05-19 LAB — FERRITIN: Ferritin: 34 ng/mL (ref 10–291)

## 2011-05-19 MED ORDER — PANTOPRAZOLE SODIUM 40 MG PO TBEC: 40.0000 mg | DELAYED_RELEASE_TABLET | Freq: Every day | ORAL | Status: DC

## 2011-05-19 MED ORDER — ZOLPIDEM TARTRATE 5 MG PO TABS: 5.0000 mg | ORAL_TABLET | Freq: Every day | ORAL | Status: DC

## 2011-05-19 MED ORDER — ACETAMINOPHEN 325 MG PO TABS: 650.0000 mg | ORAL_TABLET | Freq: Four times a day (QID) | ORAL | Status: DC | PRN

## 2011-05-19 MED ORDER — ENOXAPARIN SODIUM 40 MG/0.4ML ~~LOC~~ SOLN
40.0000 mg | SUBCUTANEOUS | Status: DC
  Filled 2011-05-19 (×2): qty 0.4

## 2011-05-19 MED ORDER — DIPHENHYDRAMINE-ZINC ACETATE 2-0.1 % EX CREA: TOPICAL_CREAM | Freq: Every day | CUTANEOUS | Status: DC | PRN

## 2011-05-19 MED ORDER — ONDANSETRON HCL 4 MG/2ML IJ SOLN: 4.0000 mg | Freq: Four times a day (QID) | INTRAMUSCULAR | Status: DC | PRN

## 2011-05-19 MED ORDER — SODIUM CHLORIDE 0.9 % IJ SOLN
3.0000 mL | Freq: Two times a day (BID) | INTRAMUSCULAR | Status: DC
  Administered 2011-05-20: 3 mL via INTRAVENOUS

## 2011-05-19 MED ORDER — ALPRAZOLAM 0.25 MG PO TABS
0.2500 mg | ORAL_TABLET | Freq: Every day | ORAL | Status: DC | PRN
  Administered 2011-05-20: 0.25 mg via ORAL

## 2011-05-19 MED ORDER — SODIUM CHLORIDE 0.9 % IV BOLUS (SEPSIS): 250.0000 mL | INTRAVENOUS | Status: DC | PRN

## 2011-05-19 MED ORDER — DIPHENHYDRAMINE HCL 25 MG PO CAPS: 25.0000 mg | ORAL_CAPSULE | Freq: Every day | ORAL | Status: DC | PRN

## 2011-05-19 MED ORDER — ONDANSETRON HCL 4 MG PO TABS: 4.0000 mg | ORAL_TABLET | Freq: Four times a day (QID) | ORAL | Status: DC | PRN

## 2011-05-19 NOTE — Progress Notes (Signed)
2D Echocardiogram with Doppler has been done. 0930 05/19/2011.  Ellin Goodie, RDCS

## 2011-05-19 NOTE — H&P (Signed)
NAMEMarland Kitchen  Christie Buckley, Christie Buckley NO.:  [id number]  MEDICAL RECORD NO.:  [id number]  LOCATION:  1431                         FACILITY:  [facility]  PHYSICIAN:  Kathlen Mody, MD       DATE OF BIRTH:  06/16/1976  DATE OF ADMISSION:  05/18/2011 DATE OF DISCHARGE:                             HISTORY & PHYSICAL   PRIMARY CARE PHYSICIAN:  None.  CHIEF COMPLAINT:  Palpitation.  HISTORY OF PRESENT ILLNESS:  A 35 year old lady with a past medical history of hypertension, not on any medication, GERD, came in complaining of 1-week of palpitations associated with some substernal chest tightness.  Substernal chest tightness has been going on for about 3 months.  The patient denies any sharp chest pain.  Denies any nausea, vomiting, abdominal pain.  She has occasional dizziness associated with palpitations.  No syncopal episodes.  No history of orthopnea or PND. No history of similar complaints in the past.  The patient has associated anxiety when she has palpitations.  Palpitations occur at rest, not associated with any physical activity.  She denies any headache.  Occasional blurry vision present.  The patient denies any tingling or numbness or any focal weakness.  She denies any fever, chills, shortness of breath, or cough.  The patient has a family history of coronary artery disease in her father at the age of 36.  REVIEW OF SYSTEMS:  See HPI, otherwise negative.  PAST MEDICAL HISTORY: 1. GERD. 2. Hypertension. 3. History of anxiety, on Xanax.  PAST SURGICAL HISTORY:  Cholecystectomy with biliary stent placement and removal in 2002.  FAMILY HISTORY:  MI in father at the age of 52, diabetes and hypertension and GERD.  SOCIAL HISTORY:  The patient lives at home with her daughter.  Denies smoking, EtOH, or recreational drug use.  Works Astronomer.  ALLERGIES:  Patient is allergic to SHELLFISH and MORPHINE SULFATE.  HOME MEDICATIONS: 1. Protonix 20 mg  daily. 2. Xanax 1 tab daily as needed for anxiety.  PHYSICAL EXAMINATION:  VITAL SIGNS:  The patient is afebrile, blood pressure 150/80, pulse initially was 100 per minute, now currently 80 per minute, respiratory rate of 16, saturating 95% on room air. GENERAL:  She is alert, well oriented x3, comfortable in no acute distress. HEENT EXAM:  Pupils reacting to light and accommodation.  No JVD.  No scleral icterus.  Moist mucous membranes. CARDIOVASCULAR: S1, S2 heard.  No murmurs, rubs, or gallops. RESPIRATORY EXAM:  Chest clear to auscultation bilaterally.  No wheezing or rhonchi. ABDOMEN:  Soft, nontender, nondistended.  Bowel sounds are heard. EXTREMITIES:  No pedal edema, cyanosis, or clubbing. NEUROLOGIC: Grossly nonfocal.  PERTINENT LABORATORIES:  The patient had point-of-care troponin, which was negative.  I-STAT Chem-8, which showed normal hemoglobin and normal electrolytes.  Urinalysis negative for nitrites and leukocytes with trace blood.  D-dimer is negative.  CBC within normal limits.  Urine pregnancy negative.  DIAGNOSTIC STUDIES:  Two-view chest x-ray shows no active disease.  ASSESSMENT AND PLAN: 1. Palpitations.  The patient will be admitted under observation to     Telemetry.  She will be monitored overnight for any arrhythmia.     The patient's 12-lead EKG showed sinus tachy  with a rate of 100 per     minute, with flipped T-waves in leads III and V3.  We will get     cardiac enzymes q.8 x2.  We will get a 2-D echocardiogram without     contrast in the morning and we will call Cardiology if needed.  We     will also get a TSH level, BNP.  The patient has a history of     anxiety, takes Xanax occasionally. 2. Gastroesophageal reflux disease.  Start the patient on Protonix 40     mg daily. 3. The patient is Jehovah's Witness.  Deep vein thrombosis prophylaxis     with subcutaneous Lovenox, dosing as per the pharmacy.           ______________________________ Kathlen Mody, MD     VA/MEDQ  D:  05/18/2011  T:  05/18/2011  Job:  [id number]  Electronically Signed by Kathlen Mody MD on 05/19/2011 11:06:57 PM

## 2011-05-20 DIAGNOSIS — D509 Iron deficiency anemia, unspecified: Secondary | ICD-10-CM | POA: Diagnosis present

## 2011-05-20 DIAGNOSIS — R000 Tachycardia, unspecified: Secondary | ICD-10-CM | POA: Diagnosis present

## 2011-05-20 DIAGNOSIS — K219 Gastro-esophageal reflux disease without esophagitis: Secondary | ICD-10-CM | POA: Diagnosis present

## 2011-05-20 DIAGNOSIS — F419 Anxiety disorder, unspecified: Secondary | ICD-10-CM | POA: Diagnosis present

## 2011-05-20 DIAGNOSIS — I10 Essential (primary) hypertension: Secondary | ICD-10-CM | POA: Diagnosis present

## 2011-05-20 MED ORDER — METOPROLOL TARTRATE 12.5 MG HALF TABLET: 12.5000 mg | ORAL_TABLET | Freq: Two times a day (BID) | ORAL | Status: DC

## 2011-05-20 MED ORDER — PANTOPRAZOLE SODIUM 40 MG PO TBEC: 40.0000 mg | DELAYED_RELEASE_TABLET | Freq: Every day | ORAL | Status: DC

## 2011-05-20 MED ORDER — METOPROLOL TARTRATE 12.5 MG HALF TABLET
12.5000 mg | ORAL_TABLET | Freq: Two times a day (BID) | ORAL | Status: DC
  Filled 2011-05-20 (×2): qty 1

## 2011-05-20 MED ORDER — FERROUS SULFATE 325 (65 FE) MG PO TABS
325.0000 mg | ORAL_TABLET | Freq: Two times a day (BID) | ORAL | Status: DC
  Filled 2011-05-20: qty 1

## 2011-05-20 MED ORDER — FERROUS SULFATE 325 (65 FE) MG PO TABS: 325.0000 mg | ORAL_TABLET | Freq: Two times a day (BID) | ORAL | Status: DC

## 2011-05-20 MED ORDER — ALPRAZOLAM 0.25 MG PO TABS: 0.2500 mg | ORAL_TABLET | Freq: Two times a day (BID) | ORAL | Status: AC | PRN

## 2011-05-20 MED ORDER — ALPRAZOLAM 0.5 MG PO TABS
ORAL_TABLET | ORAL | Status: AC
  Administered 2011-05-20: 0.25 mg via ORAL
  Filled 2011-05-20: qty 1

## 2011-05-20 MED ORDER — ALPRAZOLAM 0.25 MG PO TABS: 0.2500 mg | ORAL_TABLET | Freq: Two times a day (BID) | ORAL | Status: DC | PRN

## 2011-05-20 NOTE — Discharge Summary (Signed)
Physician Discharge Summary  Patient ID: Christie Buckley MRN: [id number] DOB/AGE: 1976/04/04 35 y.o.  Admit date: 05/18/2011 Discharge date: 05/20/2011  Primary Care Physician:  No primary provider on file.   Discharge Diagnoses:    . Sinus Tachycardia most likely secondary to anxiety. .Anxiety .GERD (gastroesophageal reflux disease) .Hypertension .Iron deficiency anemia     General Appearance:    Alert, cooperative, no distress, appears stated age  Lungs:     Clear to auscultation bilaterally, respirations unlabored   Heart:    Regular rate and rhythm, S1 and S2 normal, no murmur, rub   or gallop  Abdomen:     Soft, non-tender, bowel sounds active all four quadrants,    no masses, no organomegaly  Extremities:   Extremities normal, atraumatic, no cyanosis or edema  Pulses:   2+ and symmetric all extremities  Skin:   Skin color, texture, turgor normal, no rashes or lesions  Neurologic:   CNII-XII intact, normal strength, sensation and reflexes    Throughout     Results for orders placed during the hospital encounter of 05/18/11 (from the past 48 hour(s))  NO BLOOD PRODUCTS     Status: Normal   Collection Time   05/18/11  5:30 PM      Component Value Range Comment   Transfuse no blood products        Value: TRANSFUSE NO BLOOD PRODUCTS, VERIFIED BY V.Danna Sewell MD [phone], RCVD BY DLONG  URINE RAPID DRUG SCREEN (HOSP PERFORMED)     Status: Normal   Collection Time   05/18/11  6:05 PM      Component Value Range Comment   Opiates NONE DETECTED  NONE DETECTED     Cocaine NONE DETECTED  NONE DETECTED     Benzodiazepines NONE DETECTED  NONE DETECTED     Amphetamines NONE DETECTED  NONE DETECTED     Tetrahydrocannabinol NONE DETECTED  NONE DETECTED     Barbiturates NONE DETECTED  NONE DETECTED    CARDIAC PANEL(CRET KIN+CKTOT+MB+TROPI)     Status: Normal   Collection Time   05/18/11  7:08 PM      Component Value Range Comment   Total CK 116  7 - 177 (U/L)    CK, MB 1.8   0.3 - 4.0 (ng/mL)    Troponin I <0.30  <0.30 (ng/mL)    Relative Index 1.6  0.0 - 2.5    CARDIAC PANEL(CRET KIN+CKTOT+MB+TROPI)     Status: Normal   Collection Time   05/19/11  1:41 AM      Component Value Range Comment   Total CK 110  7 - 177 (U/L)    CK, MB 1.6  0.3 - 4.0 (ng/mL)    Troponin I <0.30  <0.30 (ng/mL)    Relative Index 1.5  0.0 - 2.5    COMPREHENSIVE METABOLIC PANEL     Status: Abnormal   Collection Time   05/19/11  1:41 AM      Component Value Range Comment   Sodium 135  135 - 145 (mEq/L)    Potassium 3.6  3.5 - 5.1 (mEq/L)    Chloride 101  96 - 112 (mEq/L)    CO2 24  19 - 32 (mEq/L)    Glucose, Bld 106 (*) 70 - 99 (mg/dL)    BUN 9  6 - 23 (mg/dL)    Creatinine, Ser 4.09  0.50 - 1.10 (mg/dL)    Calcium 9.2  8.4 - 10.5 (mg/dL)    Total Protein 6.5  6.0 - 8.3 (g/dL)    Albumin 3.4 (*) 3.5 - 5.2 (g/dL)    AST 13  0 - 37 (U/L)    ALT 16  0 - 35 (U/L)    Alkaline Phosphatase 68  39 - 117 (U/L)    Total Bilirubin 0.1 (*) 0.3 - 1.2 (mg/dL)    GFR calc non Af Amer >90  >90 (mL/min)    GFR calc Af Amer >90  >90 (mL/min)   MAGNESIUM     Status: Normal   Collection Time   05/19/11  1:41 AM      Component Value Range Comment   Magnesium 1.8  1.5 - 2.5 (mg/dL)   PHOSPHORUS     Status: Normal   Collection Time   05/19/11  1:41 AM      Component Value Range Comment   Phosphorus 4.5  2.3 - 4.6 (mg/dL)   PRO B NATRIURETIC PEPTIDE     Status: Normal   Collection Time   05/19/11  1:41 AM      Component Value Range Comment   BNP, POC 5.0  0 - 125 (pg/mL)   DIFFERENTIAL     Status: Abnormal   Collection Time   05/19/11  1:41 AM      Component Value Range Comment   Neutrophils Relative 50  43 - 77 (%)    Neutro Abs 3.3  1.7 - 7.7 (K/uL)    Lymphocytes Relative 38  12 - 46 (%)    Lymphs Abs 2.5  0.7 - 4.0 (K/uL)    Monocytes Relative 4  3 - 12 (%)    Monocytes Absolute 0.3  0.1 - 1.0 (K/uL)    Eosinophils Relative 7 (*) 0 - 5 (%)    Eosinophils Absolute 0.4  0.0 - 0.7  (K/uL)    Basophils Relative 1  0 - 1 (%)    Basophils Absolute 0.1  0.0 - 0.1 (K/uL)   CBC     Status: Abnormal   Collection Time   05/19/11  1:41 AM      Component Value Range Comment   WBC 6.7  4.0 - 10.5 (K/uL)    RBC 4.29  3.87 - 5.11 (MIL/uL)    Hemoglobin 11.4 (*) 12.0 - 15.0 (g/dL)    HCT 69.6 (*) 29.5 - 46.0 (%)    MCV 82.3  78.0 - 100.0 (fL)    MCH 26.6  26.0 - 34.0 (pg)    MCHC 32.3  30.0 - 36.0 (g/dL)    RDW 28.4  13.2 - 44.0 (%)    Platelets 368  150 - 400 (K/uL)   PROTIME-INR     Status: Normal   Collection Time   05/19/11  1:41 AM      Component Value Range Comment   Prothrombin Time 14.1  11.6 - 15.2 (seconds)    INR 1.07  0.00 - 1.49    APTT     Status: Normal   Collection Time   05/19/11  1:41 AM      Component Value Range Comment   aPTT 34  24 - 37 (seconds)   TSH     Status: Normal   Collection Time   05/19/11  1:41 AM      Component Value Range Comment   TSH 2.019  0.350 - 4.500 (uIU/mL)   LIPID PANEL     Status: Abnormal   Collection Time   05/19/11  1:41 AM      Component Value Range Comment  Cholesterol 158  0 - 200 (mg/dL)    Triglycerides 67  <161 (mg/dL)    HDL 43  >09 (mg/dL)    Total CHOL/HDL Ratio 3.7      VLDL 13  0 - 40 (mg/dL)    LDL Cholesterol 604 (*) 0 - 99 (mg/dL)   IRON AND TIBC     Status: Abnormal   Collection Time   05/19/11  2:00 PM      Component Value Range Comment   Iron 54  42 - 135 (ug/dL)    TIBC 540  981 - 191 (ug/dL)    Saturation Ratios 16 (*) 20 - 55 (%)    UIBC 276  125 - 400 (ug/dL)   VITAMIN Y78     Status: Normal   Collection Time   05/19/11  2:00 PM      Component Value Range Comment   Vitamin B-12 383  211 - 911 (pg/mL)   FOLATE     Status: Normal   Collection Time   05/19/11  2:00 PM      Component Value Range Comment   Folate 15.1     FERRITIN     Status: Normal   Collection Time   05/19/11  2:00 PM      Component Value Range Comment   Ferritin 34  10 - 291 (ng/mL)     Current Discharge Medication  List    START taking these medications   Details  ALPRAZolam (XANAX) 0.25 MG tablet Take 1 tablet (0.25 mg total) by mouth 2 (two) times daily as needed for anxiety. Qty: 30 tablet, Refills: 0    ferrous sulfate 325 (65 FE) MG tablet Take 1 tablet (325 mg total) by mouth 2 (two) times daily with a meal. Qty: 60 tablet, Refills: 1    metoprolol tartrate (LOPRESSOR) 12.5 mg TABS Take 0.5 tablets (12.5 mg total) by mouth 2 (two) times daily. Qty: 60 tablet, Refills: 0    pantoprazole (PROTONIX) 40 MG tablet Take 1 tablet (40 mg total) by mouth at bedtime. Qty: 30 tablet, Refills: 0      CONTINUE these medications which have NOT CHANGED   Details  diphenhydrAMINE (BENADRYL) 2 % cream Apply 1 application topically daily as needed. For eczema     diphenhydrAMINE (BENADRYL) 25 mg capsule Take 25 mg by mouth daily as needed. For allergies       STOP taking these medications     omeprazole (PRILOSEC) 20 MG capsule          Disposition and Follow-up: pt is hemodynamically stable for discharge home and recommended to follow up with [facility] [place] [facility] for holter monitor placement. Please call [facility] on Monday for an appointment.   Follow up with pcp in 1 to 2 weeks.   Consults:  Cardiology  From Dr Mayford Knife over the phone recommended to follow with [facility] for Possible Holter monitor placement.    Significant Diagnostic Studies:  Dg Chest 2 View  05/18/2011  *RADIOLOGY REPORT*  Clinical Data: Chest pain  CHEST - 2 VIEW  Comparison: 11/06/2008  Findings: Cardiomediastinal silhouette is stable.  No acute infiltrate or pleural effusion.  No pulmonary edema.  Bony thorax is stable.  IMPRESSION: No active disease.  No significant change.  Original Report Authenticated By: Natasha Mead, M.D.    Brief H and P: For complete details please refer to admission H and P, but in brief came in for palpitat;ions, was found to have hearburn, relived with protonix  and anxiety relieved with  xanax. She had few episodes of tachy cardia on ambulation, sinus tachy cardia, and asymptomatic.     Hospital Course:  Principal Problem:  Tachycardia: Sinus tachycardia on the ekg on admission with t wave inversions on lead III and avf, which have resolved the next day ekg. Cardiac enzymes are negative. 2 d echo showed good systolic funtion and grade one diastolic dysfunction. Over night tele monitor showed few episodes of sinus tachy on ambulation. tsh was normal. Pt is very anxious, started her on xanax. Called Dr French Ana turner from [facility] cardiology for a appointment for holter monitor placement as outpt. Also started on low dose b blockers  Active Problems:  Anxiety improved with xanax.  GERD (gastroesophageal reflux disease) resolved with protonix  Hypertension controlled   Iron deficiency anemia started on irom supplements.    Time spent on Discharge: 40 minutes/   Signed: Diane Mochizuki 05/20/2011, 1:24 PM

## 2011-05-20 NOTE — Plan of Care (Signed)
Problem: Consults Goal: Skin Care Protocol Initiated - if indicated If consults are not indicated, leave blank or document N/A  NA Goal: Nutrition Consult-if indicated NA Goal: Diabetes Guidelines if Diabetic/Glucose > 140 If diabetic or lab glucose is > 140 mg/dl - Initiate Diabetes/Hyperglycemia Guidelines & Document Interventions  NA  Problem: Phase II Progression Outcomes Goal: Obtain order to discontinue catheter if appropriate na Goal: Other Phase II Outcomes/Goals na

## 2011-05-24 ENCOUNTER — Encounter (INDEPENDENT_AMBULATORY_CARE_PROVIDER_SITE_OTHER): Payer: Self-pay

## 2011-05-24 DIAGNOSIS — R002 Palpitations: Secondary | ICD-10-CM

## 2012-01-15 ENCOUNTER — Encounter (HOSPITAL_COMMUNITY): Payer: Self-pay | Admitting: *Deleted

## 2012-01-15 ENCOUNTER — Emergency Department (HOSPITAL_COMMUNITY)
Admission: EM | Admit: 2012-01-15 | Discharge: 2012-01-15 | Disposition: A | Discharge status: Home / Self Care | Payer: Self-pay | Attending: Emergency Medicine | Admitting: Emergency Medicine

## 2012-01-15 DIAGNOSIS — I10 Essential (primary) hypertension: Secondary | ICD-10-CM | POA: Insufficient documentation

## 2012-01-15 DIAGNOSIS — F411 Generalized anxiety disorder: Secondary | ICD-10-CM | POA: Insufficient documentation

## 2012-01-15 DIAGNOSIS — R599 Enlarged lymph nodes, unspecified: Secondary | ICD-10-CM | POA: Insufficient documentation

## 2012-01-15 DIAGNOSIS — Z79899 Other long term (current) drug therapy: Secondary | ICD-10-CM | POA: Insufficient documentation

## 2012-01-15 DIAGNOSIS — R209 Unspecified disturbances of skin sensation: Secondary | ICD-10-CM | POA: Insufficient documentation

## 2012-01-15 DIAGNOSIS — L089 Local infection of the skin and subcutaneous tissue, unspecified: Secondary | ICD-10-CM | POA: Insufficient documentation

## 2012-01-15 HISTORY — DX: Anxiety disorder, unspecified: F41.9

## 2012-01-15 HISTORY — DX: Gastro-esophageal reflux disease without esophagitis: K21.9

## 2012-01-15 HISTORY — DX: Essential (primary) hypertension: I10

## 2012-01-15 MED ORDER — CEPHALEXIN 500 MG PO CAPS: 500.0000 mg | ORAL_CAPSULE | Freq: Four times a day (QID) | ORAL | Status: AC

## 2012-01-15 NOTE — ED Notes (Signed)
Pt reports insect bite on forehead that began on Saturday as a "pimple." C/o swelling above R eye and R side of face. Numbness and tingling to lips beginning this am. No sob. No distress noted. Used cold compresses with some relief. Some swelling noted to R side of forehead with small pustule at center.

## 2012-01-15 NOTE — ED Provider Notes (Cosign Needed)
History     CSN: [id number]  Arrival date & time 01/15/12  0845   First MD Initiated Contact with Patient 01/15/12 1000      Chief Complaint  Patient presents with  . Numbness    (Consider location/radiation/quality/duration/timing/severity/associated sxs/prior treatment) The history is provided by the patient.  32 y female c/o a pustule on the top of her forehead since 4 d ago.   It is associated with numbness on the right side of her forehead.  She also noted swelling.  Anterior to her right ear.  On Saturday, while she was at work.  She had numbness.  Around her mouth and on her tongue.  The numbness has resolved.  She denies fever.  She denies nausea, vomiting.  She has no other symptoms.  Past Medical History  Diagnosis Date  . Hypertension   . Anxiety   . Acid reflux     Past Surgical History  Procedure Date  . Cholecystectomy     No family history on file.  History  Substance Use Topics  . Smoking status: Never Smoker   . Smokeless tobacco: Not on file  . Alcohol Use: Yes    OB History    Grav Para Term Preterm Abortions TAB SAB Ect Mult Living                  Review of Systems  Constitutional: Negative for fever.  HENT: Negative for sore throat and voice change.   Eyes: Negative for redness.  Gastrointestinal: Negative for nausea and vomiting.  Skin: Negative for rash.       Pustule  Neurological: Negative for headaches.  Hematological: Positive for adenopathy.       Right preauricular lymphadenopathy  Psychiatric/Behavioral: Negative for confusion.  All other systems reviewed and are negative.    Allergies  Morphine and related and Shellfish allergy  Home Medications   Current Outpatient Rx  Name Route Sig Dispense Refill  . DIPHENHYDRAMINE HCL 2 % EX CREA Topical Apply 1 application topically daily as needed. For eczema     . DIPHENHYDRAMINE HCL 25 MG PO CAPS Oral Take 25 mg by mouth daily as needed. For allergies     . FERROUS SULFATE  325 (65 FE) MG PO TABS Oral Take 1 tablet (325 mg total) by mouth 2 (two) times daily with a meal. 60 tablet 1  . HYDROCHLOROTHIAZIDE 12.5 MG PO CAPS Oral Take 12.5 mg by mouth daily.    Marland Kitchen METOPROLOL TARTRATE 25 MG PO TABS Oral Take 12.5 mg by mouth 2 (two) times daily.    Marland Kitchen PANTOPRAZOLE SODIUM 40 MG PO TBEC Oral Take 1 tablet (40 mg total) by mouth at bedtime. 30 tablet 0  . CEPHALEXIN 500 MG PO CAPS Oral Take 1 capsule (500 mg total) by mouth 4 (four) times daily. 40 capsule 0    BP 129/95  Pulse 95  Temp 98.5 F (36.9 C) (Oral)  Resp 14  Ht 5\' 11"  (1.803 m)  SpO2 99%  LMP 01/11/2012  Physical Exam  Nursing note and vitals reviewed. Constitutional: She is oriented to person, place, and time. She appears well-developed and well-nourished. No distress.  HENT:  Head: Normocephalic and atraumatic.  Eyes: Conjunctivae are normal.  Neck: Normal range of motion.  Pulmonary/Chest: Effort normal.  Abdominal: She exhibits no distension.  Musculoskeletal: Normal range of motion.  Neurological: She is alert and oriented to person, place, and time.  Skin: Skin is warm and dry.  3 mm pustule at the top of her forehead, just anterior to her scalp line, with mild tenderness  Psychiatric: She has a normal mood and affect. Thought content normal.    ED Course  Procedures (including critical care time)  Labs Reviewed - No data to display No results found.   1. Pustule       MDM  Pustule        Cheri Guppy, MD 01/15/12 1043

## 2012-01-15 NOTE — ED Notes (Signed)
Pt reports small white bump/ possible bite on right side of forehead x3 days ago. Swollen, tender area below bite. Right side of face "aching". States eyes swollen x3 days, slight blurred vision. Reports lips and mucous membranes began to swell x2 days ago, numbness/tingling present on lips. Reports nausea and lightheadedness x1 day.

## 2012-01-15 NOTE — Discharge Instructions (Signed)
Use Keflex for infection.  Apply ice to reduce pain and inflammation.  Followup with your Dr. as needed.

## 2015-01-15 ENCOUNTER — Encounter (HOSPITAL_COMMUNITY): Payer: Self-pay | Admitting: Emergency Medicine

## 2015-01-15 ENCOUNTER — Emergency Department (HOSPITAL_COMMUNITY)
Admission: EM | Admit: 2015-01-15 | Discharge: 2015-01-15 | Disposition: A | Discharge status: Home / Self Care | Payer: Medicaid Other | Attending: Emergency Medicine | Admitting: Emergency Medicine

## 2015-01-15 DIAGNOSIS — R002 Palpitations: Secondary | ICD-10-CM | POA: Insufficient documentation

## 2015-01-15 DIAGNOSIS — F419 Anxiety disorder, unspecified: Secondary | ICD-10-CM

## 2015-01-15 DIAGNOSIS — I10 Essential (primary) hypertension: Secondary | ICD-10-CM | POA: Diagnosis not present

## 2015-01-15 DIAGNOSIS — Z79899 Other long term (current) drug therapy: Secondary | ICD-10-CM | POA: Insufficient documentation

## 2015-01-15 DIAGNOSIS — R0602 Shortness of breath: Secondary | ICD-10-CM | POA: Diagnosis not present

## 2015-01-15 DIAGNOSIS — K219 Gastro-esophageal reflux disease without esophagitis: Secondary | ICD-10-CM | POA: Diagnosis not present

## 2015-01-15 LAB — BASIC METABOLIC PANEL
ANION GAP: 11 (ref 5–15)
BUN: 18 mg/dL (ref 6–20)
CALCIUM: 9.3 mg/dL (ref 8.9–10.3)
CO2: 22 mmol/L (ref 22–32)
CREATININE: 0.86 mg/dL (ref 0.44–1.00)
Chloride: 103 mmol/L (ref 101–111)
GLUCOSE: 98 mg/dL (ref 65–99)
Potassium: 3.8 mmol/L (ref 3.5–5.1)
SODIUM: 136 mmol/L (ref 135–145)

## 2015-01-15 LAB — CBC
HEMATOCRIT: 38.1 % (ref 36.0–46.0)
HEMOGLOBIN: 12.5 g/dL (ref 12.0–15.0)
MCH: 27.3 pg (ref 26.0–34.0)
MCHC: 32.8 g/dL (ref 30.0–36.0)
MCV: 83.2 fL (ref 78.0–100.0)
Platelets: 331 10*3/uL (ref 150–400)
RBC: 4.58 MIL/uL (ref 3.87–5.11)
RDW: 12.5 % (ref 11.5–15.5)
WBC: 7 10*3/uL (ref 4.0–10.5)

## 2015-01-15 LAB — BRAIN NATRIURETIC PEPTIDE: B NATRIURETIC PEPTIDE 5: 15.3 pg/mL (ref 0.0–100.0)

## 2015-01-15 LAB — I-STAT TROPONIN, ED: Troponin i, poc: 0 ng/mL (ref 0.00–0.08)

## 2015-01-15 MED ORDER — LORAZEPAM 1 MG PO TABS: 1.0000 mg | ORAL_TABLET | Freq: Three times a day (TID) | ORAL | Status: AC | PRN

## 2015-01-15 MED ORDER — SODIUM CHLORIDE 0.9 % IV SOLN
Freq: Once | INTRAVENOUS | Status: AC
  Administered 2015-01-15: 50 mL/h via INTRAVENOUS

## 2015-01-15 NOTE — ED Notes (Addendum)
EKG given to EDP,[place],MD. For review. 

## 2015-01-15 NOTE — ED Notes (Signed)
Pt arrived to the ED with a complaint of palpitations.  Pt states she feels her heart is racing.  Pt alos complaining of shortness of breath and dizziness when she lays down.

## 2015-01-15 NOTE — ED Provider Notes (Signed)
CSN: [id number]     Arrival date & time 01/15/15  0349 History   First MD Initiated Contact with Patient 01/15/15 [id number]     Chief Complaint  Patient presents with  . Palpitations     (Consider location/radiation/quality/duration/timing/severity/associated sxs/prior Treatment) HPI Comments: Sit 39 year old female presents with palpitations, anxiety, insomnia, shortness of breath. She states she has not slept at all for the past 3 days when she lays back.  She feels like her heart is beating out of her chest and she develops shortness of breath.  She denies any caffeine use.  No over-the-counter medications.  No stimulants.  No energy drinks.  He states she did take a dose of Benadryl yesterday for slight rash on her face.  She has a history of recurrent hives, but it was 1 tablet midafternoon, none since then. She states she is very concerned about her health.  She is afraid that she may following her father's footsteps who had a heart attack in his 10950s and needed a heart transplant.  She is also very anxious about starting a new job on Tuesday. She states she has not taken any of her anxiety medicine every year and she forgot that she had a water pill that she could take along with her blood pressure medicine.  She has not seen her primary care provider in over a year as he changed practices  Patient is a 39 y.o. female presenting with palpitations. The history is provided by the patient.  Palpitations Palpitations quality:  Fast Onset quality:  Gradual Duration:  3 days Timing:  Intermittent Progression:  Worsening Chronicity:  Recurrent Context: anxiety   Context: not appetite suppressants, not blood loss, not bronchodilators, not caffeine, not dehydration, not exercise, not hyperventilation, not illicit drugs, not nicotine and not stimulant use   Relieved by:  None tried Worsened by:  Nothing Ineffective treatments:  None tried Associated symptoms: shortness of breath   Associated  symptoms: no chest pain, no dizziness, no lower extremity edema, no nausea, no syncope, no vomiting and no weakness   Risk factors: stress   Risk factors: no heart disease, no hx of PE, no hx of thyroid disease and no hypercoagulable state     Past Medical History  Diagnosis Date  . Hypertension   . Anxiety   . Acid reflux    Past Surgical History  Procedure Laterality Date  . Cholecystectomy     History reviewed. No pertinent family history. History  Substance Use Topics  . Smoking status: Never Smoker   . Smokeless tobacco: Not on file  . Alcohol Use: Yes   OB History    No data available     Review of Systems  Constitutional: Negative for fever.  Respiratory: Positive for shortness of breath. Negative for choking and wheezing.   Cardiovascular: Positive for palpitations. Negative for chest pain, leg swelling and syncope.  Gastrointestinal: Negative for nausea and vomiting.  Skin: Negative for rash.  Neurological: Negative for dizziness, weakness and headaches.  Psychiatric/Behavioral: The patient is nervous/anxious.   All other systems reviewed and are negative.     Allergies  Morphine and related and Shellfish allergy  Home Medications   Prior to Admission medications   Medication Sig Start Date End Date Taking? Authorizing Provider  ferrous sulfate 325 (65 FE) MG tablet Take 325 mg by mouth daily with breakfast.   Yes Historical Provider, MD  metoprolol tartrate (LOPRESSOR) 25 MG tablet Take 12.5 mg by mouth 2 (two) times daily.  Yes Historical Provider, MD  pantoprazole (PROTONIX) 40 MG tablet Take 40 mg by mouth daily.   Yes Historical Provider, MD  diphenhydrAMINE (BENADRYL) 2 % cream Apply 1 application topically daily as needed. For eczema     Historical Provider, MD  diphenhydrAMINE (BENADRYL) 25 mg capsule Take 25 mg by mouth daily as needed. For allergies     Historical Provider, MD  ferrous sulfate 325 (65 FE) MG tablet Take 1 tablet (325 mg total) by  mouth 2 (two) times daily with a meal. 05/20/11 05/19/12  Kathlen Mody, MD  hydrochlorothiazide (MICROZIDE) 12.5 MG capsule Take 12.5 mg by mouth daily.    Historical Provider, MD  LORazepam (ATIVAN) 1 MG tablet Take 1 tablet (1 mg total) by mouth 3 (three) times daily as needed for anxiety. 01/15/15   Earley Favor, NP  pantoprazole (PROTONIX) 40 MG tablet Take 1 tablet (40 mg total) by mouth at bedtime. 05/20/11 05/19/12  Kathlen Mody, MD   BP 126/88 mmHg  Pulse 66  Temp(Src) 98.6 F (37 C) (Oral)  Resp 12  SpO2 100%  LMP 12/16/2014 (Approximate) Physical Exam  Constitutional: She is oriented to person, place, and time. She appears well-developed and well-nourished.  Eyes: Pupils are equal, round, and reactive to light.  Neck: Normal range of motion.  Cardiovascular: Normal rate, regular rhythm and normal heart sounds.   No murmur heard. Pulmonary/Chest: Effort normal. No respiratory distress. She has no wheezes.  Abdominal: Soft.  Musculoskeletal: Normal range of motion.  Neurological: She is alert and oriented to person, place, and time.  Skin: Skin is warm. No rash noted. No erythema.  Nursing note and vitals reviewed.   ED Course  Procedures (including critical care time) Labs Review Labs Reviewed  CBC  BASIC METABOLIC PANEL  BRAIN NATRIURETIC PEPTIDE  I-STAT TROPOININ, ED  I-STAT CHEM 8, ED  I-STAT TROPOININ, ED    Imaging Review No results found.   EKG Interpretation None      MDM   Final diagnoses:  Anxiety  Palpitations         Earley Favor, NP 01/15/15 5638  Loren Racer, MD 01/15/15 [phone]

## 2015-01-15 NOTE — Discharge Instructions (Signed)
Reevaluation for palpitations was normal, your EKG and cardiac markers were all within normal parameters.  Please continue taking your blood pressure medication given a prescription for Ativan to help control your panic/anxiety.  You've also been given a referral to community wellness.  Please call and make an appointment  with a primary care physician

## 2015-01-15 NOTE — ED Notes (Signed)
Pt. On cardiac monitor. 

## 2015-11-20 ENCOUNTER — Encounter (HOSPITAL_COMMUNITY): Payer: Self-pay | Admitting: Emergency Medicine

## 2015-11-20 ENCOUNTER — Emergency Department (HOSPITAL_COMMUNITY)
Admission: EM | Admit: 2015-11-20 | Discharge: 2015-11-20 | Disposition: A | Discharge status: Home / Self Care | Payer: Medicaid Other | Attending: Emergency Medicine | Admitting: Emergency Medicine

## 2015-11-20 DIAGNOSIS — K029 Dental caries, unspecified: Secondary | ICD-10-CM | POA: Insufficient documentation

## 2015-11-20 DIAGNOSIS — Z79899 Other long term (current) drug therapy: Secondary | ICD-10-CM | POA: Insufficient documentation

## 2015-11-20 DIAGNOSIS — K219 Gastro-esophageal reflux disease without esophagitis: Secondary | ICD-10-CM | POA: Insufficient documentation

## 2015-11-20 DIAGNOSIS — K05219 Aggressive periodontitis, localized, unspecified severity: Secondary | ICD-10-CM | POA: Insufficient documentation

## 2015-11-20 DIAGNOSIS — I10 Essential (primary) hypertension: Secondary | ICD-10-CM | POA: Insufficient documentation

## 2015-11-20 DIAGNOSIS — F419 Anxiety disorder, unspecified: Secondary | ICD-10-CM | POA: Insufficient documentation

## 2015-11-20 MED ORDER — PENICILLIN V POTASSIUM 500 MG PO TABS: 500.0000 mg | ORAL_TABLET | Freq: Four times a day (QID) | ORAL | Status: AC

## 2015-11-20 NOTE — ED Notes (Addendum)
Pt states that she has had facial swelling that started in her gums for 2 days and was primarily on the right side of her face, she states that now she has tingling in her left arm. Pt states she had teeth removed where the swelling began about 10 years ago and she "has always felt like he left something in". Pt has normal neuro exam and has equal sensation bilaterally. Pt denies recent illness and denies n,v,d

## 2015-11-20 NOTE — ED Provider Notes (Signed)
CSN: [id number]     Arrival date & time 11/20/15  2024 History   First MD Initiated Contact with Patient 11/20/15 2049     Chief Complaint  Patient presents with  . Facial Swelling  . Tingling    face arm     (Consider location/radiation/quality/duration/timing/severity/associated sxs/prior Treatment) HPI Comments: Patient here complaining of intermittent right-sided facial swelling as well as tooth pain in her right lower jaw. No fever or chills. No trouble swallowing. No neurological findings. States that the paresthesias go from the right to the left side of her face. She may be positional. No no treatment used prior to arrival.  The history is provided by the patient.    Past Medical History  Diagnosis Date  . Hypertension   . Anxiety   . Acid reflux    Past Surgical History  Procedure Laterality Date  . Cholecystectomy     No family history on file. Social History  Substance Use Topics  . Smoking status: Never Smoker   . Smokeless tobacco: None  . Alcohol Use: Yes   OB History    No data available     Review of Systems  All other systems reviewed and are negative.     Allergies  Morphine and related and Shellfish allergy  Home Medications   Prior to Admission medications   Medication Sig Start Date End Date Taking? Authorizing Provider  atenolol (TENORMIN) 25 MG tablet Take 25 mg by mouth daily.   Yes Historical Provider, MD  ferrous sulfate 325 (65 FE) MG tablet Take 325 mg by mouth daily with breakfast.   Yes Historical Provider, MD  hydrochlorothiazide (MICROZIDE) 12.5 MG capsule Take 12.5 mg by mouth daily.   Yes Historical Provider, MD  LORazepam (ATIVAN) 1 MG tablet Take 1 tablet (1 mg total) by mouth 3 (three) times daily as needed for anxiety. 01/15/15  Yes Earley Favor, NP  pantoprazole (PROTONIX) 40 MG tablet Take 40 mg by mouth daily.   Yes Historical Provider, MD  ferrous sulfate 325 (65 FE) MG tablet Take 1 tablet (325 mg total) by mouth 2 (two)  times daily with a meal. 05/20/11 05/19/12  Kathlen Mody, MD  pantoprazole (PROTONIX) 40 MG tablet Take 1 tablet (40 mg total) by mouth at bedtime. 05/20/11 05/19/12  Kathlen Mody, MD   BP 144/89 mmHg  Pulse 85  Temp(Src) 98.4 F (36.9 C) (Oral)  Resp 20  SpO2 100% Physical Exam  Constitutional: She is oriented to person, place, and time. She appears well-developed and well-nourished.  Non-toxic appearance. No distress.  HENT:  Head: Normocephalic and atraumatic.  Mouth/Throat:    Eyes: Conjunctivae, EOM and lids are normal. Pupils are equal, round, and reactive to light.  Neck: Normal range of motion. Neck supple. No tracheal deviation present. No thyroid mass present.  Cardiovascular: Normal rate, regular rhythm and normal heart sounds.  Exam reveals no gallop.   No murmur heard. Pulmonary/Chest: Effort normal and breath sounds normal. No stridor. No respiratory distress. She has no decreased breath sounds. She has no wheezes. She has no rhonchi. She has no rales.  Abdominal: Soft. Normal appearance and bowel sounds are normal. She exhibits no distension. There is no tenderness. There is no rebound and no CVA tenderness.  Musculoskeletal: Normal range of motion. She exhibits no edema or tenderness.  Lymphadenopathy:    She has no cervical adenopathy.  Neurological: She is alert and oriented to person, place, and time. She has normal strength. No cranial nerve  deficit or sensory deficit. GCS eye subscore is 4. GCS verbal subscore is 5. GCS motor subscore is 6.  Skin: Skin is warm and dry. No abrasion and no rash noted.  Psychiatric: She has a normal mood and affect. Her speech is normal and behavior is normal.  Nursing note and vitals reviewed.   ED Course  Procedures (including critical care time) Labs Review Labs Reviewed - No data to display  Imaging Review No results found. I have personally reviewed and evaluated these images and lab results as part of my medical  decision-making.   EKG Interpretation None      MDM   Final diagnoses:  None    A short multiple dental caries noted. Does have some possible infection around an old tooth fracture. We'll place on antibiotics and she will follow-up with her dentist    Lorre NickAnthony Grayton Lobo, MD 11/20/15 2107

## 2015-11-20 NOTE — Discharge Instructions (Signed)

## 2016-04-04 ENCOUNTER — Encounter (HOSPITAL_COMMUNITY): Payer: Self-pay | Admitting: Emergency Medicine

## 2016-04-04 ENCOUNTER — Emergency Department (HOSPITAL_COMMUNITY): Payer: Medicaid Other

## 2016-04-04 ENCOUNTER — Emergency Department (HOSPITAL_COMMUNITY)
Admission: EM | Admit: 2016-04-04 | Discharge: 2016-04-04 | Disposition: A | Discharge status: Home / Self Care | Payer: Medicaid Other | Attending: Emergency Medicine | Admitting: Emergency Medicine

## 2016-04-04 DIAGNOSIS — Z760 Encounter for issue of repeat prescription: Secondary | ICD-10-CM | POA: Insufficient documentation

## 2016-04-04 DIAGNOSIS — F419 Anxiety disorder, unspecified: Secondary | ICD-10-CM | POA: Insufficient documentation

## 2016-04-04 DIAGNOSIS — I10 Essential (primary) hypertension: Secondary | ICD-10-CM | POA: Insufficient documentation

## 2016-04-04 DIAGNOSIS — R0789 Other chest pain: Secondary | ICD-10-CM | POA: Insufficient documentation

## 2016-04-04 DIAGNOSIS — Z79899 Other long term (current) drug therapy: Secondary | ICD-10-CM | POA: Insufficient documentation

## 2016-04-04 LAB — CBC
HCT: 36.7 % (ref 36.0–46.0)
HEMOGLOBIN: 11.9 g/dL — AB (ref 12.0–15.0)
MCH: 27.3 pg (ref 26.0–34.0)
MCHC: 32.4 g/dL (ref 30.0–36.0)
MCV: 84.2 fL (ref 78.0–100.0)
Platelets: 348 10*3/uL (ref 150–400)
RBC: 4.36 MIL/uL (ref 3.87–5.11)
RDW: 13.2 % (ref 11.5–15.5)
WBC: 7.1 10*3/uL (ref 4.0–10.5)

## 2016-04-04 LAB — BASIC METABOLIC PANEL
ANION GAP: 7 (ref 5–15)
BUN: 12 mg/dL (ref 6–20)
CALCIUM: 9 mg/dL (ref 8.9–10.3)
CO2: 21 mmol/L — AB (ref 22–32)
Chloride: 109 mmol/L (ref 101–111)
Creatinine, Ser: 0.82 mg/dL (ref 0.44–1.00)
Glucose, Bld: 119 mg/dL — ABNORMAL HIGH (ref 65–99)
Potassium: 3.4 mmol/L — ABNORMAL LOW (ref 3.5–5.1)
Sodium: 137 mmol/L (ref 135–145)

## 2016-04-04 LAB — POC URINE PREG, ED: Preg Test, Ur: NEGATIVE

## 2016-04-04 LAB — I-STAT TROPONIN, ED: TROPONIN I, POC: 0.01 ng/mL (ref 0.00–0.08)

## 2016-04-04 MED ORDER — METHOCARBAMOL 500 MG PO TABS
1000.0000 mg | ORAL_TABLET | Freq: Once | ORAL | Status: AC
  Administered 2016-04-04: 1000 mg via ORAL
  Filled 2016-04-04: qty 2

## 2016-04-04 MED ORDER — HYDROCHLOROTHIAZIDE 12.5 MG PO TABS: 25.0000 mg | ORAL_TABLET | Freq: Every day | ORAL | 0 refills | Status: AC

## 2016-04-04 MED ORDER — KETOROLAC TROMETHAMINE 60 MG/2ML IM SOLN
60.0000 mg | Freq: Once | INTRAMUSCULAR | Status: AC
  Administered 2016-04-04: 60 mg via INTRAMUSCULAR
  Filled 2016-04-04: qty 2

## 2016-04-04 MED ORDER — NAPROXEN 500 MG PO TABS: 500.0000 mg | ORAL_TABLET | Freq: Two times a day (BID) | ORAL | 0 refills | Status: DC

## 2016-04-04 MED ORDER — METHOCARBAMOL 500 MG PO TABS: 500.0000 mg | ORAL_TABLET | Freq: Two times a day (BID) | ORAL | 0 refills | Status: AC

## 2016-04-04 MED ORDER — PENICILLIN V POTASSIUM 500 MG PO TABS
500.0000 mg | ORAL_TABLET | Freq: Once | ORAL | Status: AC
  Administered 2016-04-04: 500 mg via ORAL
  Filled 2016-04-04: qty 1

## 2016-04-04 MED ORDER — FERROUS SULFATE 325 (65 FE) MG PO TABS: 325.0000 mg | ORAL_TABLET | Freq: Two times a day (BID) | ORAL | 0 refills | Status: AC

## 2016-04-04 MED ORDER — PANTOPRAZOLE SODIUM 20 MG PO TBEC: 20.0000 mg | DELAYED_RELEASE_TABLET | Freq: Every day | ORAL | 0 refills | Status: AC

## 2016-04-04 MED ORDER — GI COCKTAIL ~~LOC~~
30.0000 mL | Freq: Once | ORAL | Status: AC
  Administered 2016-04-04: 30 mL via ORAL
  Filled 2016-04-04: qty 30

## 2016-04-04 NOTE — ED Triage Notes (Signed)
Pt from home with complaints of central chest pain that has been ongoing for about 2 weeks. Pt states she has pressure in her upper chest (immediately below her neck) that has been ongoing x 2 hours. Pt states she this wraps around her neck. Pt denies lightheadedness, nausea, or vomiting. Pt states that she feels like her lips are tingling.   Pt states she has been off her htn and anxiety medication for about 3 weeks due to a loss in insurance.

## 2016-04-04 NOTE — ED Notes (Signed)
Pt ambulatory and independent at discharge.  Verbalized understanding of discharge instructions 

## 2016-04-04 NOTE — ED Provider Notes (Signed)
[facility] Provider Note   CSN: [id number] Arrival date & time: 04/04/16  0017  By signing my name below, I, Linna Darner, attest that this documentation has been prepared under the direction and in the presence of physician practitioner, Koji Niehoff, MD. Electronically Signed: Linna Darner, Scribe. 04/04/2016. 3:17 AM.  History   Chief Complaint Chief Complaint  Patient presents with  . Chest Pain    The history is provided by the patient. No language interpreter was used.  Chest Pain   This is a new problem. The current episode started more than 1 week ago. The problem occurs constantly. The problem has not changed since onset.The pain is associated with exertion, walking, movement, raising an arm and rest. Pain location: emtire chest. The pain is moderate. The quality of the pain is described as sharp and dull. Duration of episode(s) is 2 weeks. The symptoms are aggravated by certain positions. Pertinent negatives include no abdominal pain, no back pain, no claudication, no cough, no diaphoresis, no dizziness, no exertional chest pressure, no fever, no headaches, no hemoptysis, no irregular heartbeat, no leg pain, no lower extremity edema, no malaise/fatigue, no nausea, no near-syncope, no numbness, no orthopnea, no palpitations, no PND, no shortness of breath, no sputum production, no syncope, no vomiting and no weakness. She has tried rest for the symptoms. The treatment provided no relief. Risk factors: mild HTN.  Her past medical history is significant for anxiety/panic attacks and hypertension.  Pertinent negatives for past medical history include no congenital heart disease, no connective tissue disease, no COPD, no CHF, no DVT, no MI, no pacemaker and no PE.  Pertinent negatives for family medical history include: no aortic dissection and no CAD.    HPI Comments: Christie Buckley is a 40 y.o. female with PMHx of HTN, GERD, and anxiety who presents to the Emergency  Department complaining of constant central chest pain for the last two weeks. She states her pain worsened several hours PTA and became sharp. Pt states her sharp pain presented at rest. Pt has not tried any medications or treatments for her pain. Pt reports she has been out of all of her medications for the last three weeks; she notes she does not have health insurance and states she cannot get her medications filled. Pt denies recent heavy lifting or recent immobilization. She further denies leg swelling or any other associated symptoms.  Past Medical History:  Diagnosis Date  . Acid reflux   . Anxiety   . Hypertension     Patient Active Problem List   Diagnosis Date Noted  . Tachycardia 05/20/2011  . Anxiety 05/20/2011  . GERD (gastroesophageal reflux disease) 05/20/2011  . Hypertension 05/20/2011  . Iron deficiency anemia 05/20/2011    Past Surgical History:  Procedure Laterality Date  . CHOLECYSTECTOMY      OB History    No data available       Home Medications    Prior to Admission medications   Medication Sig Start Date End Date Taking? Authorizing Provider  atenolol (TENORMIN) 25 MG tablet Take 25 mg by mouth daily.   Yes Historical Provider, MD  ferrous sulfate 325 (65 FE) MG tablet Take 325 mg by mouth daily with breakfast.   Yes Historical Provider, MD  hydrochlorothiazide (MICROZIDE) 12.5 MG capsule Take 12.5 mg by mouth daily.   Yes Historical Provider, MD  LORazepam (ATIVAN) 1 MG tablet Take 1 tablet (1 mg total) by mouth 3 (three) times daily as needed for anxiety.  01/15/15  Yes Earley FavorGail Schulz, NP  pantoprazole (PROTONIX) 40 MG tablet Take 40 mg by mouth daily.   Yes Historical Provider, MD  ferrous sulfate 325 (65 FE) MG tablet Take 1 tablet (325 mg total) by mouth 2 (two) times daily with a meal. 05/20/11 05/19/12  Kathlen ModyVijaya Akula, MD  pantoprazole (PROTONIX) 40 MG tablet Take 1 tablet (40 mg total) by mouth at bedtime. 05/20/11 05/19/12  Kathlen ModyVijaya Akula, MD    Family  History No family history on file.  Social History Social History  Substance Use Topics  . Smoking status: Never Smoker  . Smokeless tobacco: Never Used  . Alcohol use Yes     Comment: socaily     Allergies   Morphine and related and Shellfish allergy   Review of Systems Review of Systems  Constitutional: Negative for diaphoresis, fever and malaise/fatigue.  Respiratory: Negative for cough, hemoptysis, sputum production, chest tightness and shortness of breath.   Cardiovascular: Positive for chest pain. Negative for palpitations, orthopnea, claudication, leg swelling, syncope, PND and near-syncope.  Gastrointestinal: Negative for abdominal pain, nausea and vomiting.  Musculoskeletal: Negative for back pain.  Neurological: Negative for dizziness, weakness, numbness and headaches.  Psychiatric/Behavioral: The patient is nervous/anxious.   All other systems reviewed and are negative.   Physical Exam Updated Vital Signs BP 126/89   Pulse 75   Temp 98.3 F (36.8 C) (Oral)   Resp 17   Ht 5\' 10"  (1.778 m)   LMP 03/02/2016   SpO2 98%   Physical Exam  Constitutional: She appears well-developed and well-nourished.  HENT:  Head: Normocephalic.  Mouth/Throat: Uvula is midline, oropharynx is clear and moist and mucous membranes are normal. No oropharyngeal exudate.  Eyes: Conjunctivae and EOM are normal. Pupils are equal, round, and reactive to light. Right eye exhibits no discharge. Left eye exhibits no discharge. No scleral icterus.  Neck: Normal range of motion. Neck supple. No JVD present. No tracheal deviation present.  Trachea is midline. No stridor or carotid bruits.   Cardiovascular: Normal rate, regular rhythm, normal heart sounds and intact distal pulses.   No murmur heard. Pulmonary/Chest: Effort normal and breath sounds normal. No stridor. No respiratory distress. She has no wheezes. She has no rales. She exhibits tenderness.  Lungs CTA bilaterally. Reproducible  pain across chest with palpation and movement of BUE  Abdominal: Soft. Bowel sounds are normal. She exhibits no distension. There is no tenderness. There is no rebound and no guarding.  Musculoskeletal: Normal range of motion. She exhibits no edema or tenderness.  Intact DP pulse bilaterally. Compartments soft. No cords.   Lymphadenopathy:    She has no cervical adenopathy.  Neurological: She is alert. She has normal reflexes. She displays normal reflexes. She exhibits normal muscle tone.  Skin: Skin is warm and dry. Capillary refill takes less than 2 seconds.  Psychiatric: She has a normal mood and affect. Her behavior is normal.  Nursing note and vitals reviewed.   ED Treatments / Results   Vitals:   04/04/16 0430 04/04/16 0500  BP: 140/97 139/92  Pulse: 68 62  Resp: 12 16  Temp:     Results for orders placed or performed during the hospital encounter of 04/04/16  Basic metabolic panel  Result Value Ref Range   Sodium 137 135 - 145 mmol/L   Potassium 3.4 (L) 3.5 - 5.1 mmol/L   Chloride 109 101 - 111 mmol/L   CO2 21 (L) 22 - 32 mmol/L   Glucose, Bld 119 (H)  65 - 99 mg/dL   BUN 12 6 - 20 mg/dL   Creatinine, Ser 1.61 0.44 - 1.00 mg/dL   Calcium 9.0 8.9 - 09.6 mg/dL   GFR calc non Af Amer >60 >60 mL/min   GFR calc Af Amer >60 >60 mL/min   Anion gap 7 5 - 15  CBC  Result Value Ref Range   WBC 7.1 4.0 - 10.5 K/uL   RBC 4.36 3.87 - 5.11 MIL/uL   Hemoglobin 11.9 (L) 12.0 - 15.0 g/dL   HCT 04.5 40.9 - 81.1 %   MCV 84.2 78.0 - 100.0 fL   MCH 27.3 26.0 - 34.0 pg   MCHC 32.4 30.0 - 36.0 g/dL   RDW 91.4 78.2 - 95.6 %   Platelets 348 150 - 400 K/uL  I-stat troponin, ED  Result Value Ref Range   Troponin i, poc 0.01 0.00 - 0.08 ng/mL   Comment 3          POC Urine Pregnancy, ED (do NOT order at [facility])  Result Value Ref Range   Preg Test, Ur NEGATIVE NEGATIVE   Dg Chest 2 View  Result Date: 04/04/2016 CLINICAL DATA:  Initial evaluation for acute upper mid chest pain for 2  weeks. EXAM: CHEST  2 VIEW COMPARISON:  Prior radiograph from 05/18/2011. FINDINGS: The cardiac and mediastinal silhouettes are stable in size and contour, and remain within normal limits. The lungs are normally inflated. No airspace consolidation, pleural effusion, or pulmonary edema is identified. There is no pneumothorax. No acute osseous abnormality identified. IMPRESSION: No active cardiopulmonary disease. Electronically Signed   By: Rise Mu M.D.   On: 04/04/2016 01:15   Medications  ketorolac (TORADOL) injection 60 mg (60 mg Intramuscular Given 04/04/16 0355)  methocarbamol (ROBAXIN) tablet 1,000 mg (1,000 mg Oral Given 04/04/16 0355)  gi cocktail (Maalox,Lidocaine,Donnatal) (30 mLs Oral Given 04/04/16 0356)  penicillin v potassium (VEETID) tablet 500 mg (500 mg Oral Given 04/04/16 0355)   Medications  ketorolac (TORADOL) injection 60 mg (60 mg Intramuscular Given 04/04/16 0355)  methocarbamol (ROBAXIN) tablet 1,000 mg (1,000 mg Oral Given 04/04/16 0355)  gi cocktail (Maalox,Lidocaine,Donnatal) (30 mLs Oral Given 04/04/16 0356)  penicillin v potassium (VEETID) tablet 500 mg (500 mg Oral Given 04/04/16 0355)     EKG  EKG Interpretation  Date/Time:  Wednesday April 04 2016 00:21:48 EDT Ventricular Rate:  75 PR Interval:    QRS Duration: 88 QT Interval:  381 QTC Calculation: 426 R Axis:   79 Text Interpretation:  Sinus rhythm Confirmed by [facility]  MD, Teletha Petrea ([id number]) on 04/04/2016 1:36:01 AM       Radiology Dg Chest 2 View  Result Date: 04/04/2016 CLINICAL DATA:  Initial evaluation for acute upper mid chest pain for 2 weeks. EXAM: CHEST  2 VIEW COMPARISON:  Prior radiograph from 05/18/2011. FINDINGS: The cardiac and mediastinal silhouettes are stable in size and contour, and remain within normal limits. The lungs are normally inflated. No airspace consolidation, pleural effusion, or pulmonary edema is identified. There is no pneumothorax. No acute osseous abnormality  identified. IMPRESSION: No active cardiopulmonary disease. Electronically Signed   By: Rise Mu M.D.   On: 04/04/2016 01:15    Procedures Procedures (including critical care time)  DIAGNOSTIC STUDIES: Oxygen Saturation is 100% on RA, normal by my interpretation.    COORDINATION OF CARE: 3:17 AM Discussed treatment plan with pt at bedside and pt agreed to plan.  Medications Ordered in ED Medications - No data to display  Initial Impression / Assessment and Plan / ED Course  I have reviewed the triage vital signs and the nursing notes.  Pertinent labs & imaging results that were available during my care of the patient were reviewed by me and considered in my medical decision making (see chart for details).  Clinical Course   Normal EKG and troponin in the setting of ongoing highly atypical CP excludes ACS.  HEART score is 1, low suspicion for MACE.  PERC negative and wells 0, highly doubt PE in this extremely low risk patient  Pain is nearly resolved post medication and is clearly MSK in nature.  Will refill hctz, iron, and protonix until can be seen by a new PMD.  PCN for dental pain and follow up with dentist.  Naproxen and robaxin for chest wall pain.   All questions answered to patient's satisfaction. Based on history and exam patient has been appropriately medically screened and emergency conditions excluded. Patient is stable for discharge at this time. Follow up with your PMD for recheck in 2 days and strict return precautions given   I personally performed the services described in this documentation, which was scribed in my presence. The recorded information has been reviewed and is accurate.     Final Clinical Impressions(s) / ED Diagnoses   Final diagnoses:  None    New Prescriptions New Prescriptions   No medications on file     Alinna Siple, MD 04/04/16 [phone]

## 2017-02-28 ENCOUNTER — Emergency Department (HOSPITAL_COMMUNITY): Payer: Medicaid Other

## 2017-02-28 ENCOUNTER — Encounter (HOSPITAL_COMMUNITY): Payer: Self-pay | Admitting: Emergency Medicine

## 2017-02-28 ENCOUNTER — Emergency Department (HOSPITAL_COMMUNITY)
Admission: EM | Admit: 2017-02-28 | Discharge: 2017-02-28 | Disposition: A | Discharge status: Home / Self Care | Payer: Medicaid Other | Attending: Emergency Medicine | Admitting: Emergency Medicine

## 2017-02-28 DIAGNOSIS — I10 Essential (primary) hypertension: Secondary | ICD-10-CM | POA: Insufficient documentation

## 2017-02-28 DIAGNOSIS — K047 Periapical abscess without sinus: Secondary | ICD-10-CM

## 2017-02-28 DIAGNOSIS — R0789 Other chest pain: Secondary | ICD-10-CM | POA: Insufficient documentation

## 2017-02-28 LAB — BASIC METABOLIC PANEL
ANION GAP: 10 (ref 5–15)
BUN: 12 mg/dL (ref 6–20)
CALCIUM: 10 mg/dL (ref 8.9–10.3)
CHLORIDE: 101 mmol/L (ref 101–111)
CO2: 25 mmol/L (ref 22–32)
Creatinine, Ser: 0.84 mg/dL (ref 0.44–1.00)
GFR calc non Af Amer: >60 mL/min (ref >60)
GLUCOSE: 98 mg/dL (ref 65–99)
POTASSIUM: 3.3 mmol/L — AB (ref 3.5–5.1)
Sodium: 136 mmol/L (ref 135–145)

## 2017-02-28 LAB — POCT I-STAT TROPONIN I: TROPONIN I, POC: 0 ng/mL (ref 0.00–0.08)

## 2017-02-28 LAB — CBC
HEMATOCRIT: 40.1 % (ref 36.0–46.0)
HEMOGLOBIN: 13.4 g/dL (ref 12.0–15.0)
MCH: 27.1 pg (ref 26.0–34.0)
MCHC: 33.4 g/dL (ref 30.0–36.0)
MCV: 81.2 fL (ref 78.0–100.0)
Platelets: 417 10*3/uL — ABNORMAL HIGH (ref 150–400)
RBC: 4.94 MIL/uL (ref 3.87–5.11)
RDW: 13 % (ref 11.5–15.5)
WBC: 7.9 10*3/uL (ref 4.0–10.5)

## 2017-02-28 MED ORDER — PENICILLIN V POTASSIUM 500 MG PO TABS: 500.0000 mg | ORAL_TABLET | Freq: Three times a day (TID) | ORAL | 0 refills | Status: DC

## 2017-02-28 MED ORDER — NAPROXEN 500 MG PO TABS: 500.0000 mg | ORAL_TABLET | Freq: Two times a day (BID) | ORAL | 0 refills | Status: AC

## 2017-02-28 MED ORDER — OXYCODONE-ACETAMINOPHEN 5-325 MG PO TABS
1.0000 | ORAL_TABLET | Freq: Once | ORAL | Status: AC
  Administered 2017-02-28: 1 via ORAL
  Filled 2017-02-28: qty 1

## 2017-02-28 NOTE — Discharge Instructions (Signed)
Return to the ED with any concerns including difficulty breathing, fainting, vomiting and not able to keep down liquids or medications, fever/chills, difficulty swallowing, decrease level of alertness/lethargy, or any other alarming symptoms

## 2017-02-28 NOTE — ED Triage Notes (Addendum)
Patient here from home with complaints of central chest pain that started two nights ago. Dizziness, blurred vision. Denies nausea/vomiting. Hx anxiety, acid reflux.

## 2017-02-28 NOTE — ED Provider Notes (Signed)
[facility] Provider Note   CSN: [id number] Arrival date & time: 02/28/17  1709     History   Chief Complaint Chief Complaint  Patient presents with  . Chest Pain  . Dizziness    HPI Christie Buckley is a 41 y.o. female.  HPI  Pt with hx of anxiety, gerd, hypertension presenting with c/o chest discomfort as well as dental pain.  She states over the past month she has had some midsternal chest pain.  Difficult to describe.  Not associated with exertion.  No radiation of pain, no nausea or diaphoresis.  No hx of DVT/PE, no leg swelling, no recent travel/trauma/surgery.  She also c/o right lower dental pain in the area of a previously broken tooth.  There are no other associated systemic symptoms, there are no other alleviating or modifying factors.   Past Medical History:  Diagnosis Date  . Acid reflux   . Anxiety   . Hypertension     Patient Active Problem List   Diagnosis Date Noted  . Tachycardia 05/20/2011  . Anxiety 05/20/2011  . GERD (gastroesophageal reflux disease) 05/20/2011  . Hypertension 05/20/2011  . Iron deficiency anemia 05/20/2011    Past Surgical History:  Procedure Laterality Date  . CHOLECYSTECTOMY      OB History    No data available       Home Medications    Prior to Admission medications   Medication Sig Start Date End Date Taking? Authorizing Provider  diphenhydrAMINE (BENADRYL) 25 MG tablet Take 25 mg by mouth every 6 (six) hours as needed for allergies.   Yes [provider]  Multiple Vitamin (MULTIVITAMIN WITH MINERALS) TABS tablet Take 1 tablet by mouth daily.   Yes [provider]  ferrous sulfate 325 (65 FE) MG tablet Take 1 tablet (325 mg total) by mouth 2 (two) times daily with a meal. Patient not taking: Reported on 02/28/2017 04/04/16   Palumbo, April, MD  hydrochlorothiazide (HYDRODIURIL) 12.5 MG tablet Take 2 tablets (25 mg total) by mouth daily. Patient not taking: Reported on 02/28/2017 04/04/16    Palumbo, April, MD  LORazepam (ATIVAN) 1 MG tablet Take 1 tablet (1 mg total) by mouth 3 (three) times daily as needed for anxiety. Patient not taking: Reported on 02/28/2017 01/15/15   Earley Favor, NP  methocarbamol (ROBAXIN) 500 MG tablet Take 1 tablet (500 mg total) by mouth 2 (two) times daily. Patient not taking: Reported on 02/28/2017 04/04/16   Palumbo, April, MD  naproxen (NAPROSYN) 500 MG tablet Take 1 tablet (500 mg total) by mouth 2 (two) times daily. 02/28/17   Laelle Bridgett, Latanya Maudlin, MD  pantoprazole (PROTONIX) 20 MG tablet Take 1 tablet (20 mg total) by mouth daily. Patient not taking: Reported on 02/28/2017 04/04/16   Palumbo, April, MD  penicillin v potassium (VEETID) 500 MG tablet Take 1 tablet (500 mg total) by mouth 3 (three) times daily. 02/28/17   Teighan Aubert, Latanya Maudlin, MD    Family History No family history on file.  Social History Social History  Substance Use Topics  . Smoking status: Never Smoker  . Smokeless tobacco: Never Used  . Alcohol use Yes     Comment: socaily     Allergies   Morphine and related and Shellfish allergy   Review of Systems Review of Systems  ROS reviewed and all otherwise negative except for mentioned in HPI   Physical Exam Updated Vital Signs BP (!) 147/100   Pulse 75   Temp 98.5 F (36.9  C)   Resp 20   LMP 02/17/2017   SpO2 100%  Vitals reivewed Physical Exam  Physical Examination: General appearance - alert, well appearing, and in no distress Mental status - alert, oriented to person, place, and time Eyes - no conjunctival injection, no scleral icterus Mouth - mucous membranes moist, pharynx normal without lesions. Mild erythema of OP, no exudate, palate symmetric, uvula midline, dental erosion on right lower jaw with ttp over remainder of tooth, no swelling under tongue Neck - supple, no significant adenopathy Chest - clear to auscultation, no wheezes, rales or rhonchi, symmetric air entry Heart - normal rate, regular rhythm, normal S1,  S2, no murmurs, rubs, clicks or gallops Abdomen - soft, nontender, nondistended, no masses or organomegaly Neurological - alert, oriented x 3, normal speech, moving all extremities Extremities - peripheral pulses normal, no pedal edema, no clubbing or cyanosis Skin - normal coloration and turgor, no rashes   ED Treatments / Results  Labs (all labs ordered are listed, but only abnormal results are displayed) Labs Reviewed  BASIC METABOLIC PANEL - Abnormal; Notable for the following:       Result Value   Potassium 3.3 (*)    All other components within normal limits  CBC - Abnormal; Notable for the following:    Platelets 417 (*)    All other components within normal limits  I-STAT TROPONIN, ED  POCT I-STAT TROPONIN I    EKG  EKG Interpretation  Date/Time:  Thursday February 28 2017 17:17:48 EDT Ventricular Rate:  79 PR Interval:    QRS Duration: 92 QT Interval:  372 QTC Calculation: 427 R Axis:   75 Text Interpretation:  Sinus rhythm Nonspecific T abnormalities, anterior leads compared to previous< isolated V3 t wave inversion.no STEMI Confirmed by Arby Barrette [phone]) on 02/28/2017 5:25:48 PM       Radiology Dg Chest 2 View  Result Date: 02/28/2017 CLINICAL DATA:  Centered chest pain with dizziness and headache EXAM: CHEST  2 VIEW COMPARISON:  04/04/2016 FINDINGS: The heart size and mediastinal contours are within normal limits. Both lungs are clear. The visualized skeletal structures are unremarkable. IMPRESSION: No active cardiopulmonary disease. Electronically Signed   By: Jasmine Pang M.D.   On: 02/28/2017 18:15    Procedures Procedures (including critical care time)  Medications Ordered in ED Medications  oxyCODONE-acetaminophen (PERCOCET/ROXICET) 5-325 MG per tablet 1 tablet (1 tablet Oral Given 02/28/17 1952)     Initial Impression / Assessment and Plan / ED Course  I have reviewed the triage vital signs and the nursing notes.  Pertinent labs & imaging  results that were available during my care of the patient were reviewed by me and considered in my medical decision making (see chart for details).   pt has a heart score of 1, PERC negative.   Pt presenting with c/o chest pain over the past month as well as dental pain, on exam she has likely periapical abcess that is associated with tooth that is eroded down to the gumline.  EKG, CXR, labs reassuring, pt is heart score of 1, doubt ACS, PERC negative, very low risk for PE.  Pt given rx for penicillin, antiinflammatories, dental resource guide.  Discharged with strict return precautions.  Pt agreeable with plan.  Final Clinical Impressions(s) / ED Diagnoses   Final diagnoses:  Atypical chest pain  Dental abscess    New Prescriptions Discharge Medication List as of 02/28/2017  7:19 PM    START taking these medications  Details  penicillin v potassium (VEETID) 500 MG tablet Take 1 tablet (500 mg total) by mouth 3 (three) times daily., Starting Thu 02/28/2017, Print         Timmy Cleverly, Latanya MaudlinMartha L, MD 02/28/17 2258

## 2017-10-26 ENCOUNTER — Other Ambulatory Visit: Payer: Self-pay

## 2017-10-26 ENCOUNTER — Ambulatory Visit (HOSPITAL_COMMUNITY)
Admission: EM | Admit: 2017-10-26 | Discharge: 2017-10-26 | Disposition: A | Discharge status: Home / Self Care | Payer: Self-pay | Attending: Family Medicine | Admitting: Family Medicine

## 2017-10-26 ENCOUNTER — Encounter (HOSPITAL_COMMUNITY): Payer: Self-pay

## 2017-10-26 DIAGNOSIS — T3110 Burns involving 10-19% of body surface with 0% to 9% third degree burns: Secondary | ICD-10-CM

## 2017-10-26 MED ORDER — MUPIROCIN CALCIUM 2 % EX CREA: 1.0000 "application " | TOPICAL_CREAM | Freq: Two times a day (BID) | CUTANEOUS | 0 refills | Status: AC
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
## 2018-06-15 IMAGING — CR DG CHEST 2V
2 series · 2 of 2 positions shown · non-contrast
Comparison: 04/04/2016

CLINICAL DATA: Centered chest pain with dizziness and headache

EXAM:
CHEST  2 VIEW

[w chest pa]
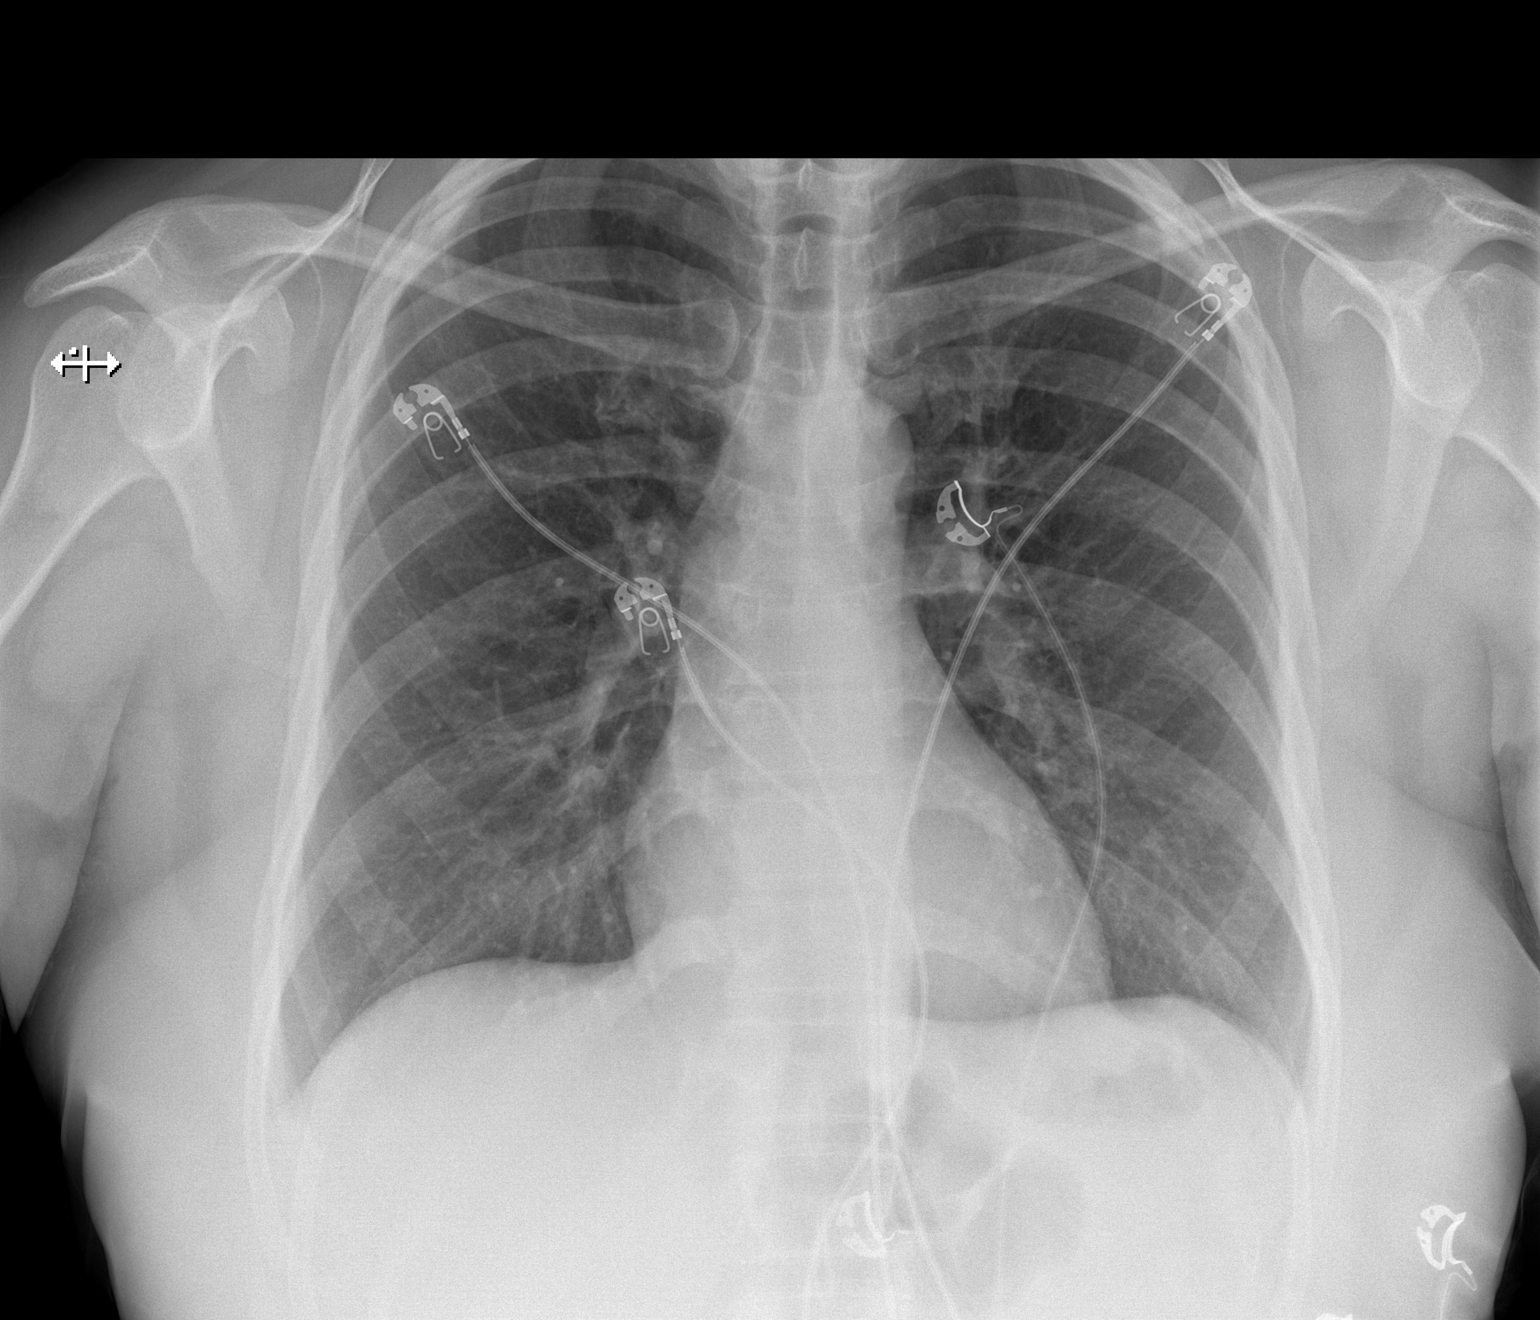

[w chest lat]
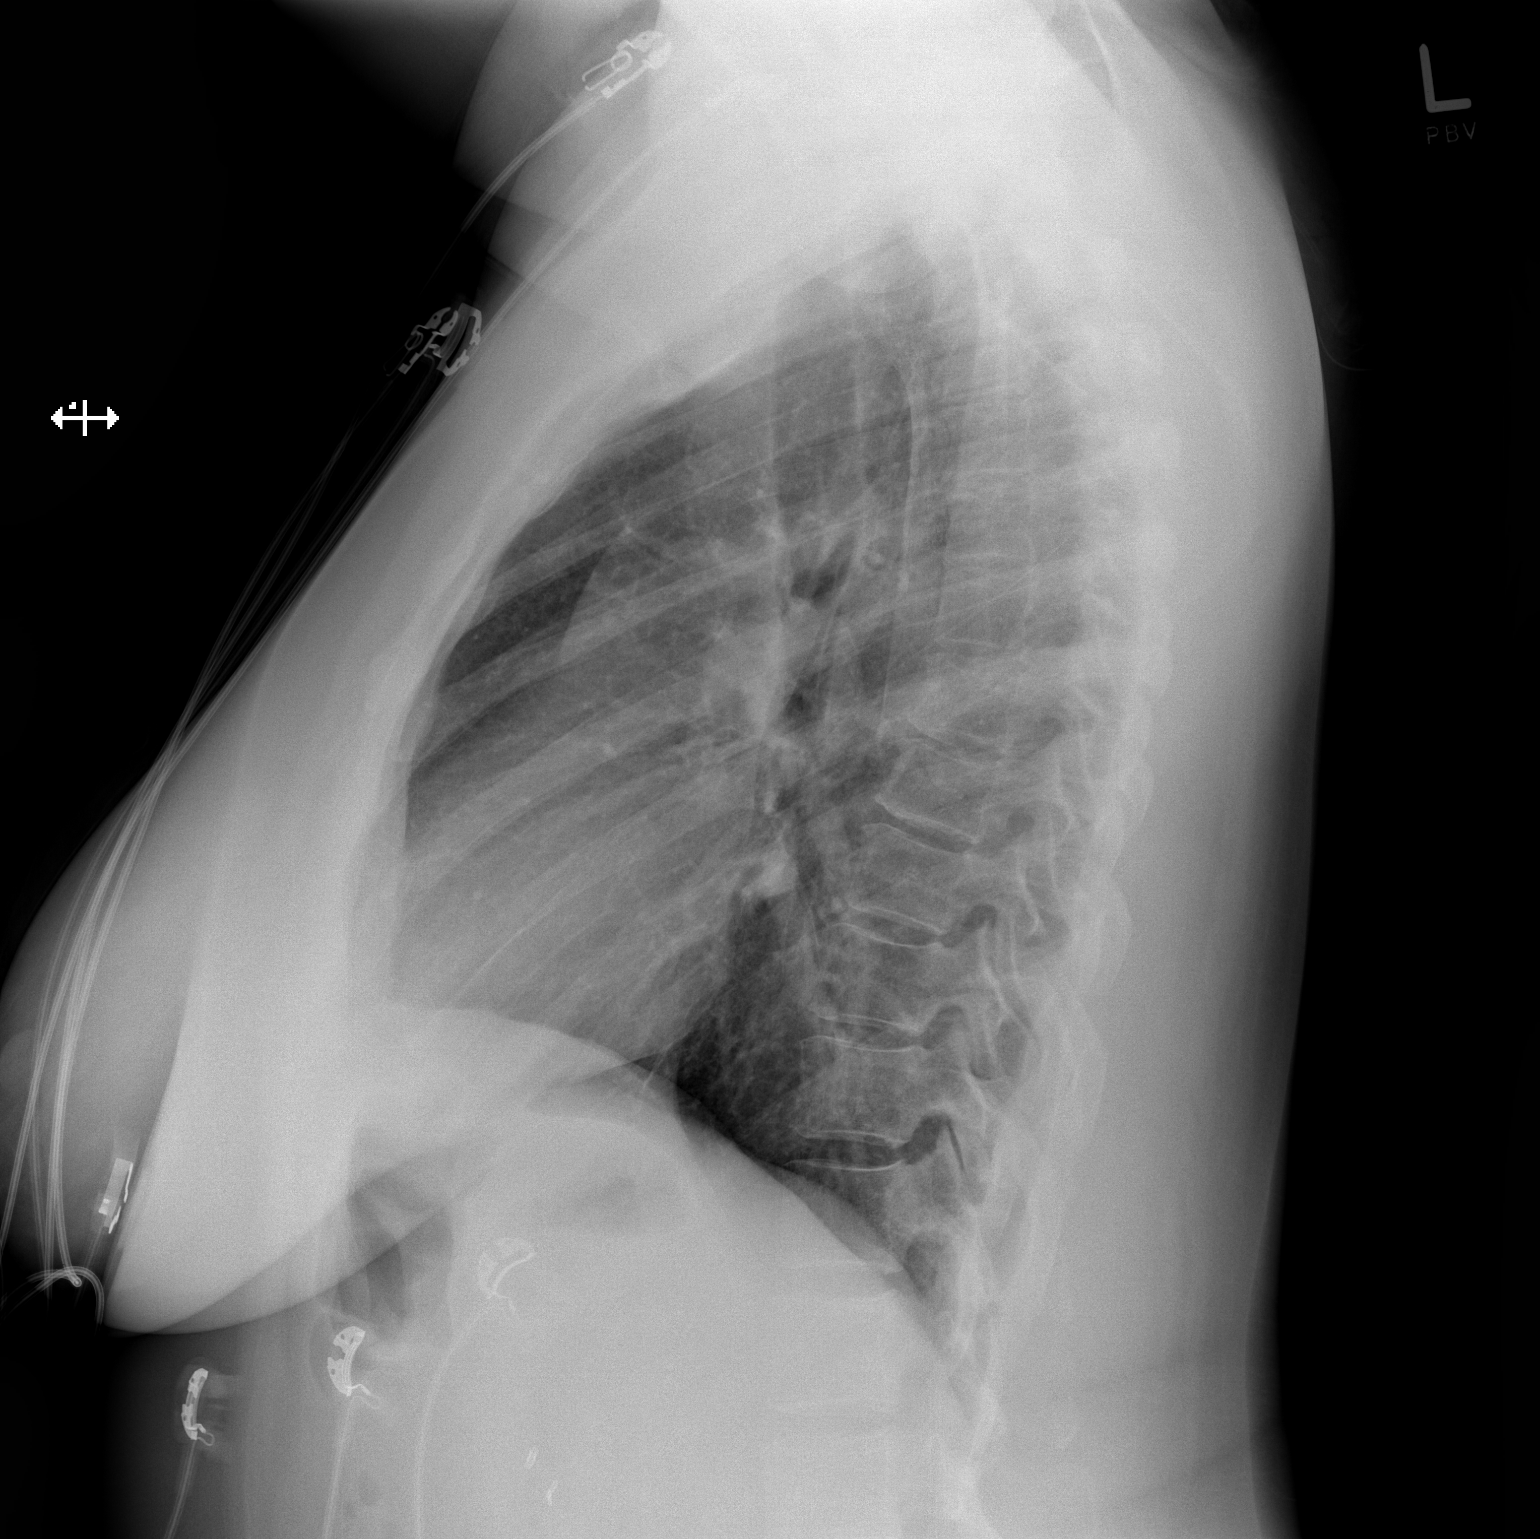

[2 of 2 positions shown; findings below may reference images not displayed]

FINDINGS: The heart size and mediastinal contours are within normal limits.
Both lungs are clear. The visualized skeletal structures are
unremarkable.
IMPRESSION: No active cardiopulmonary disease.

## 2021-09-20 ENCOUNTER — Other Ambulatory Visit: Payer: Self-pay | Admitting: Internal Medicine

## 2021-09-20 DIAGNOSIS — Z1231 Encounter for screening mammogram for malignant neoplasm of breast: Secondary | ICD-10-CM

## 2022-04-05 ENCOUNTER — Other Ambulatory Visit: Payer: Self-pay | Admitting: Internal Medicine

## 2022-04-05 DIAGNOSIS — Z1231 Encounter for screening mammogram for malignant neoplasm of breast: Secondary | ICD-10-CM

## 2022-06-06 ENCOUNTER — Ambulatory Visit: Payer: 59

## 2022-06-11 ENCOUNTER — Encounter: Payer: Self-pay | Admitting: Internal Medicine

## 2023-04-08 ENCOUNTER — Other Ambulatory Visit: Payer: Self-pay

## 2023-04-08 ENCOUNTER — Emergency Department (HOSPITAL_COMMUNITY)
Admission: EM | Admit: 2023-04-08 | Discharge: 2023-04-08 | Disposition: A | Discharge status: Home / Self Care | Payer: Medicaid Other | Attending: Emergency Medicine | Admitting: Emergency Medicine

## 2023-04-08 ENCOUNTER — Encounter (HOSPITAL_COMMUNITY): Payer: Self-pay | Admitting: *Deleted

## 2023-04-08 DIAGNOSIS — K5900 Constipation, unspecified: Secondary | ICD-10-CM | POA: Diagnosis not present

## 2023-04-08 DIAGNOSIS — R1032 Left lower quadrant pain: Secondary | ICD-10-CM | POA: Diagnosis present

## 2023-04-08 DIAGNOSIS — I10 Essential (primary) hypertension: Secondary | ICD-10-CM | POA: Insufficient documentation

## 2023-04-08 DIAGNOSIS — N3000 Acute cystitis without hematuria: Secondary | ICD-10-CM | POA: Diagnosis not present

## 2023-04-08 DIAGNOSIS — Z79899 Other long term (current) drug therapy: Secondary | ICD-10-CM | POA: Insufficient documentation

## 2023-04-08 DIAGNOSIS — R7989 Other specified abnormal findings of blood chemistry: Secondary | ICD-10-CM | POA: Insufficient documentation

## 2023-04-08 LAB — COMPREHENSIVE METABOLIC PANEL
ALT: 19 U/L (ref 0–44)
AST: 16 U/L (ref 15–41)
Albumin: 3.6 g/dL (ref 3.5–5.0)
Alkaline Phosphatase: 58 U/L (ref 38–126)
Anion gap: 8 (ref 5–15)
BUN: 10 mg/dL (ref 6–20)
CO2: 22 mmol/L (ref 22–32)
Calcium: 8.8 mg/dL — ABNORMAL LOW (ref 8.9–10.3)
Chloride: 106 mmol/L (ref 98–111)
Creatinine, Ser: 0.8 mg/dL (ref 0.44–1.00)
GFR, Estimated: >60 mL/min (ref >60)
Glucose, Bld: 98 mg/dL (ref 70–99)
Potassium: 3.6 mmol/L (ref 3.5–5.1)
Sodium: 136 mmol/L (ref 135–145)
Total Bilirubin: 0.6 mg/dL (ref 0.3–1.2)
Total Protein: 6.5 g/dL (ref 6.5–8.1)

## 2023-04-08 LAB — CBC WITH DIFFERENTIAL/PLATELET
Abs Immature Granulocytes: 0.01 10*3/uL (ref 0.00–0.07)
Basophils Absolute: 0.1 10*3/uL (ref 0.0–0.1)
Basophils Relative: 1 %
Eosinophils Absolute: 0.3 10*3/uL (ref 0.0–0.5)
Eosinophils Relative: 8 %
HCT: 43.2 % (ref 36.0–46.0)
Hemoglobin: 13.4 g/dL (ref 12.0–15.0)
Immature Granulocytes: 0 %
Lymphocytes Relative: 34 %
Lymphs Abs: 1.5 10*3/uL (ref 0.7–4.0)
MCH: 27.7 pg (ref 26.0–34.0)
MCHC: 31 g/dL (ref 30.0–36.0)
MCV: 89.4 fL (ref 80.0–100.0)
Monocytes Absolute: 0.3 10*3/uL (ref 0.1–1.0)
Monocytes Relative: 7 %
Neutro Abs: 2.1 10*3/uL (ref 1.7–7.7)
Neutrophils Relative %: 50 %
Platelets: 338 10*3/uL (ref 150–400)
RBC: 4.83 MIL/uL (ref 3.87–5.11)
RDW: 13.9 % (ref 11.5–15.5)
WBC: 4.3 10*3/uL (ref 4.0–10.5)
nRBC: 0 % (ref 0.0–0.2)

## 2023-04-08 LAB — URINALYSIS, ROUTINE W REFLEX MICROSCOPIC
Bilirubin Urine: NEGATIVE
Glucose, UA: NEGATIVE mg/dL
Ketones, ur: NEGATIVE mg/dL
Nitrite: NEGATIVE
Protein, ur: NEGATIVE mg/dL
Specific Gravity, Urine: 1.019 (ref 1.005–1.030)
pH: 5 (ref 5.0–8.0)

## 2023-04-08 LAB — POC URINE PREG, ED: Preg Test, Ur: NEGATIVE

## 2023-04-08 LAB — LIPASE, BLOOD: Lipase: 59 U/L — ABNORMAL HIGH (ref 11–51)

## 2023-04-08 MED ORDER — CEPHALEXIN 500 MG PO CAPS: 500.0000 mg | ORAL_CAPSULE | Freq: Two times a day (BID) | ORAL | 0 refills | Status: AC

## 2023-04-08 NOTE — ED Triage Notes (Signed)
Pt reporting lower left abdominal pain that started about a week. Intermittent. Urine has been darker than normal.

## 2023-04-08 NOTE — Discharge Instructions (Addendum)
Your urine shows that you may have a UTI (urinary tract infection).  You have been provided a paper prescription for an antibiotic to treat this UTI.  You have been prescribed cephalexin (keflex). Take this antibiotic 2 times a day for the next 5 days. Take the full course of your antibiotic even if you start feeling better. Antibiotics may cause you to have diarrhea.  Your other lab work today is very reassuring.  I suspect your constipation may be contributing to your abdominal pain.  Drink plenty of fluids at home and eat a fiber fillet diet (lots of fruits and vegetables). Please engage in daily exercise as this also helps to maintain regular bowel movements.   Try to use the bathroom after eating a meal. Place your feet up on a small stepstool when trying to have a bowel movement.  Take 1 scoop of Miralax in the morning and 1 scoop in the evening until you have a bowel movement.  Then you may take 1 scoop daily to maintain normal bowel movements.  You can get this medication over-the-counter at any drugstore.  Return to the ER if you are no longer passing gas or having bowel movements, your abdominal pain worsens, you have unexplained fever or chills, any other new or concerning symptoms.

## 2023-04-08 NOTE — ED Provider Notes (Signed)
[facility] Provider Note   CSN: [id number] Arrival date & time: 04/08/23  2130     History  Chief Complaint  Patient presents with   Abdominal Pain    Christie Buckley is a 47 y.o. female with history of cholecystectomy, GERD, hypertension, anxiety, presents with concern for 1 week of generalized lower abdominal pain.  States she has not had a bowel movement in over a week, but does note some constipation at baseline. Still  Reports she normally has about 2 bowel movements a week..  Denies any nausea or vomiting, or diarrhea.  Denies any fever or chills. Notes it feels uncomfortable to urinate which is new but no burning, hematuria, increased frequency.  Last menstrual period 03/18/2023   Abdominal Pain      Home Medications Prior to Admission medications   Medication Sig Start Date End Date Taking? Authorizing Provider  cephALEXin (KEFLEX) 500 MG capsule Take 1 capsule (500 mg total) by mouth 2 (two) times daily for 5 days. 04/08/23 04/13/23 Yes Arabella Merles, PA-C  ALPRAZolam Prudy Feeler) 1 MG tablet Take 1 mg by mouth 2 (two) times daily as needed for anxiety.    [provider]  diphenhydrAMINE (BENADRYL) 25 MG tablet Take 25 mg by mouth every 6 (six) hours as needed for allergies.    [provider]  ferrous sulfate 325 (65 FE) MG tablet Take 1 tablet (325 mg total) by mouth 2 (two) times daily with a meal. 04/04/16   Palumbo, April, MD  hydrochlorothiazide (HYDRODIURIL) 12.5 MG tablet Take 2 tablets (25 mg total) by mouth daily. 04/04/16   Palumbo, April, MD  LORazepam (ATIVAN) 1 MG tablet Take 1 tablet (1 mg total) by mouth 3 (three) times daily as needed for anxiety. Patient not taking: Reported on 02/28/2017 01/15/15   Earley Favor, NP  methocarbamol (ROBAXIN) 500 MG tablet Take 1 tablet (500 mg total) by mouth 2 (two) times daily. Patient not taking: Reported on 02/28/2017 04/04/16   Palumbo, April, MD  Multiple  Vitamin (MULTIVITAMIN WITH MINERALS) TABS tablet Take 1 tablet by mouth daily.    [provider]  mupirocin cream (BACTROBAN) 2 % Apply 1 application topically 2 (two) times daily. 10/26/17   Frederica Kuster, MD  naproxen (NAPROSYN) 500 MG tablet Take 1 tablet (500 mg total) by mouth 2 (two) times daily. 02/28/17   Mabe, Latanya Maudlin, MD  pantoprazole (PROTONIX) 20 MG tablet Take 1 tablet (20 mg total) by mouth daily. 04/04/16   Palumbo, April, MD      Allergies    Morphine and codeine and Shellfish allergy    Review of Systems   Review of Systems  Gastrointestinal:  Positive for abdominal pain.    Physical Exam Updated Vital Signs BP (!) 149/109 (BP Location: Right Arm)   Pulse 78   Temp 98.6 F (37 C) (Oral)   Resp 18   LMP 03/18/2023   SpO2 100%  Physical Exam Vitals and nursing note reviewed.  Constitutional:      General: She is not in acute distress.    Appearance: She is well-developed.  HENT:     Head: Normocephalic and atraumatic.  Eyes:     Conjunctiva/sclera: Conjunctivae normal.  Cardiovascular:     Rate and Rhythm: Normal rate and regular rhythm.     Heart sounds: No murmur heard. Pulmonary:     Effort: Pulmonary effort is normal. No respiratory distress.     Breath sounds: Normal  breath sounds.  Abdominal:     Palpations: Abdomen is soft.     Tenderness: There is no abdominal tenderness.     Comments: No abdominal tenderness to palpation, no rebound or guarding  Musculoskeletal:        General: No swelling.     Cervical back: Neck supple.  Skin:    General: Skin is warm and dry.     Capillary Refill: Capillary refill takes less than 2 seconds.  Neurological:     Mental Status: She is alert.  Psychiatric:        Mood and Affect: Mood normal.     ED Results / Procedures / Treatments   Labs (all labs ordered are listed, but only abnormal results are displayed) Labs Reviewed  COMPREHENSIVE METABOLIC PANEL - Abnormal; Notable for the following  components:      Result Value   Calcium 8.8 (*)    All other components within normal limits  LIPASE, BLOOD - Abnormal; Notable for the following components:   Lipase 59 (*)    All other components within normal limits  URINALYSIS, ROUTINE W REFLEX MICROSCOPIC - Abnormal; Notable for the following components:   APPearance CLOUDY (*)    Hgb urine dipstick MODERATE (*)    Leukocytes,Ua MODERATE (*)    Bacteria, UA RARE (*)    All other components within normal limits  CBC WITH DIFFERENTIAL/PLATELET  POC URINE PREG, ED    EKG None  Radiology No results found.  Procedures Procedures    Medications Ordered in ED Medications - No data to display  ED Course/ Medical Decision Making/ A&P                                 Medical Decision Making Amount and/or Complexity of Data Reviewed Labs: ordered.  Risk Prescription drug management.   47 y.o. female with pertinent past medical history of cholecystectomy, GERD, hypertension, anxiety presents to the ED for concern of generalized abdominal pain for 1 week  Differential diagnosis includes but is not limited to appendicitis, pancreatitis, UTI, constipation  ED Course:  Patient very well-appearing, no fever or tachycardia.  No abdominal tenderness to palpation.  CBC with no leukocytosis, CMP without any elevation in LFTs. Lipase only mildly elevated at 59, no concern for pancreatitis.  Urinalysis does show leukocytes, and given patient endorsing some mild urinary discomfort, will treat for UTI with 5-day course of Keflex. Patient notes no bowel movement for the past week, but has still been passing gas.  No concern for small bowel obstruction at this time. I also suspect patient's constipation is contributing to her abdominal pain.  She has not had a bowel movement in over a week.  I discussed eating fiber filled foods and drink plenty water.  Discussed use of MiraLAX with the  patient.   Impression: UTI Constipation  Disposition:  The patient was discharged home with instructions to take course of Keflex as prescribed.  Use 1 scoop MiraLAX each morning and night until she has a bowel movement, then once daily for constipation as instructed. Return precautions given.  Lab Tests: I Ordered, and personally interpreted labs.  The pertinent results include:   CBC without leukocytosis Pregnancy negative    Co morbidities that complicate the patient evaluation  cholecystectomy              Final Clinical Impression(s) / ED Diagnoses Final diagnoses:  Acute cystitis without  hematuria  Constipation, unspecified constipation type    Rx / DC Orders ED Discharge Orders          Ordered    cephALEXin (KEFLEX) 500 MG capsule  2 times daily        04/08/23 0806              Arabella Merles, PA-C 04/08/23 7829    Anders Simmonds T, DO 04/09/23 [phone]
